# Patient Record
Sex: Female | Born: 1990 | Race: White | Hispanic: No | Marital: Single | Smoking: Never smoker
Health system: Southern US, Community
[De-identification: ages and names within clinical notes are randomized; demographics above are authoritative.]

## PROBLEM LIST (undated history)

## (undated) ENCOUNTER — Inpatient Hospital Stay (HOSPITAL_COMMUNITY): Payer: Self-pay

## (undated) DIAGNOSIS — R87629 Unspecified abnormal cytological findings in specimens from vagina: Secondary | ICD-10-CM

## (undated) DIAGNOSIS — R51 Headache: Secondary | ICD-10-CM

## (undated) DIAGNOSIS — F840 Autistic disorder: Secondary | ICD-10-CM

## (undated) DIAGNOSIS — F419 Anxiety disorder, unspecified: Secondary | ICD-10-CM

## (undated) DIAGNOSIS — G8929 Other chronic pain: Secondary | ICD-10-CM

## (undated) DIAGNOSIS — F329 Major depressive disorder, single episode, unspecified: Secondary | ICD-10-CM

## (undated) DIAGNOSIS — F32A Depression, unspecified: Secondary | ICD-10-CM

## (undated) DIAGNOSIS — F909 Attention-deficit hyperactivity disorder, unspecified type: Secondary | ICD-10-CM

## (undated) DIAGNOSIS — N83201 Unspecified ovarian cyst, right side: Secondary | ICD-10-CM

## (undated) DIAGNOSIS — R102 Pelvic and perineal pain: Secondary | ICD-10-CM

## (undated) DIAGNOSIS — R519 Headache, unspecified: Secondary | ICD-10-CM

## (undated) DIAGNOSIS — N83202 Unspecified ovarian cyst, left side: Secondary | ICD-10-CM

## (undated) DIAGNOSIS — M797 Fibromyalgia: Secondary | ICD-10-CM

## (undated) DIAGNOSIS — T883XXA Malignant hyperthermia due to anesthesia, initial encounter: Secondary | ICD-10-CM

## (undated) DIAGNOSIS — Q969 Turner's syndrome, unspecified: Secondary | ICD-10-CM

## (undated) HISTORY — DX: Pelvic and perineal pain: R10.2

## (undated) HISTORY — DX: Turner's syndrome, unspecified: Q96.9

## (undated) HISTORY — DX: Other chronic pain: G89.29

## (undated) HISTORY — DX: Fibromyalgia: M79.7

## (undated) HISTORY — PX: LAPAROTOMY: SHX154

## (undated) HISTORY — PX: SALPINGOOPHORECTOMY: SHX82

---
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
## 2010-03-07 ENCOUNTER — Encounter (INDEPENDENT_AMBULATORY_CARE_PROVIDER_SITE_OTHER): Payer: Self-pay | Admitting: Obstetrics and Gynecology

## 2010-03-07 ENCOUNTER — Ambulatory Visit (HOSPITAL_COMMUNITY): Admission: RE | Admit: 2010-03-07 | Discharge: 2010-03-08 | Payer: Self-pay | Admitting: Obstetrics and Gynecology

## 2010-07-24 LAB — CBC
HCT: 34.2 % — ABNORMAL LOW (ref 36.0–46.0)
HCT: 39.9 % (ref 36.0–46.0)
Hemoglobin: 11.4 g/dL — ABNORMAL LOW (ref 12.0–15.0)
Hemoglobin: 13.5 g/dL (ref 12.0–15.0)
MCH: 29.2 pg (ref 26.0–34.0)
MCH: 29.2 pg (ref 26.0–34.0)
MCHC: 33.2 g/dL (ref 30.0–36.0)
MCHC: 33.8 g/dL (ref 30.0–36.0)
MCV: 86.5 fL (ref 78.0–100.0)
MCV: 88.2 fL (ref 78.0–100.0)
Platelets: 182 10*3/uL (ref 150–400)
Platelets: 219 10*3/uL (ref 150–400)
RBC: 3.88 MIL/uL (ref 3.87–5.11)
RBC: 4.61 MIL/uL (ref 3.87–5.11)
RDW: 12.7 % (ref 11.5–15.5)
RDW: 13.1 % (ref 11.5–15.5)
WBC: 10.9 10*3/uL — ABNORMAL HIGH (ref 4.0–10.5)
WBC: 5.9 10*3/uL (ref 4.0–10.5)

## 2010-07-24 LAB — PREGNANCY, URINE: Preg Test, Ur: NEGATIVE

## 2010-07-24 LAB — SURGICAL PCR SCREEN
MRSA, PCR: NEGATIVE
Staphylococcus aureus: POSITIVE — AB

## 2010-08-05 LAB — CBC
HCT: 43 % (ref 36.0–46.0)
Hemoglobin: 14.1 g/dL (ref 12.0–15.0)
MCHC: 32.8 g/dL (ref 30.0–36.0)
MCV: 90.3 fL (ref 78.0–100.0)
Platelets: 298 10*3/uL (ref 150–400)
RBC: 4.76 MIL/uL (ref 3.87–5.11)
RDW: 14.6 % (ref 11.5–15.5)
WBC: 8.9 10*3/uL (ref 4.0–10.5)

## 2010-08-05 LAB — PREGNANCY, URINE: Preg Test, Ur: NEGATIVE

## 2010-09-27 NOTE — Consult Note (Signed)
NAMEKERIGAN, Amber Hancock NO.:  [id number]   MEDICAL RECORD NO.:  [id number]                   PATIENT TYPE:  EMS   LOCATION:  URG                                  FACILITY:  [facility]   PHYSICIAN:  Artist Pais. Mina Marble, M.D.           DATE OF BIRTH:  [birth year]   DATE OF CONSULTATION:  04/03/2003  DATE OF DISCHARGE:                                   CONSULTATION   REFERRING PHYSICIAN:  Elvina Sidle, M.D.   REASON FOR CONSULTATION:  Carrol is a 20 year old right-hand dominant female  who fell onto her left thumb and presents with what appears to be a  dislocated CMC joint.  She is an otherwise healthy 20 year old.   ALLERGIES:  No known drug allergies..   CURRENT MEDICATIONS:  None.   No recent hospitalizations or surgeries.   FAMILY HISTORY:  Noncontributory.   SOCIAL HISTORY:  Noncontributory.   PHYSICAL EXAMINATION:  She has what appears to be a dislocated CMC joint  on  x-ray.  On clinical examination, she is tender over the [facility] joint.  We  discussed infiltrative block.  She does not want this.  We gently applied  traction and manipulation to the base of the metacarpal and it seemed to  reduce.  Post reduction film showed some slight subluxation but equal to her  other side with comparison views of the right.  She was placed in a well-  padded thumb spica splint and will follow up in my office in 48 hours.                                               Artist Pais Mina Marble, M.D.    MAW/MEDQ  D:  04/03/2003  T:  04/03/2003  Job:  [id number]

## 2012-02-11 ENCOUNTER — Ambulatory Visit (INDEPENDENT_AMBULATORY_CARE_PROVIDER_SITE_OTHER): Payer: BC Managed Care – PPO | Admitting: Internal Medicine

## 2012-02-11 ENCOUNTER — Encounter: Payer: Self-pay | Admitting: Internal Medicine

## 2012-02-11 VITALS — BP 133/97 | HR 76 | Resp 18 | Ht 62.0 in | Wt 120.8 lb

## 2012-02-11 DIAGNOSIS — R55 Syncope and collapse: Secondary | ICD-10-CM

## 2012-02-11 MED ORDER — FLUDROCORTISONE ACETATE 0.1 MG PO TABS: 0.0500 mg | ORAL_TABLET | Freq: Two times a day (BID) | ORAL | Status: DC

## 2012-02-11 NOTE — Patient Instructions (Addendum)
Your physician recommends that you schedule a follow-up appointment in: 3 months with Dr Ladona Ridgel  Your physician has recommended you make the following change in your medication: START Florinef 0.1 mg 1/2 tab twice daily  INCREASE fluid and salt intake and DECREASE caffeine intake

## 2012-02-11 NOTE — Progress Notes (Signed)
HPI Amber Hancock is referred today by Dr. Vincente Poli for evaluation of recurrent syncope. The patient has a h/o recurrent syncope dating back several years. These episodes typically occur with and are associated with lower pelvic pain. She will feel clammy, dizzy and lightheaded and pass out. Her boyfriend who is with her today notes that she will be out only a minute or two. No tongue biting or loss of bowel or bladder continency. She notes associate ringing in her ears and reduced hearing prior to the episodes. No other complaints. She also carries a diagnosis of panic attacks and anxiety disorder. Not on File   Current Outpatient Prescriptions  Medication Sig Dispense Refill  . diazepam (VALIUM) 5 MG tablet Take 5 mg by mouth every 12 (twelve) hours as needed.       Marland Kitchen HYDROmorphone (DILAUDID) 4 MG tablet Take 4 mg by mouth every 4 (four) hours as needed.       Marland Kitchen MEPERITAB 50 MG tablet 50 mg every 4 (four) hours as needed.       . ondansetron (ZOFRAN-ODT) 8 MG disintegrating tablet Take 8 mg by mouth every 8 (eight) hours as needed.       . traMADol (ULTRAM) 50 MG tablet       . fludrocortisone (FLORINEF) 0.1 MG tablet Take 0.5 tablets (0.05 mg total) by mouth 2 (two) times daily.  30 tablet  6     No past medical history on file.  ROS:   All systems reviewed and negative except as noted in the HPI.   No past surgical history on file.   No family history on file.   History   Social History  . Marital Status: Single    Spouse Name: N/A    Number of Children: N/A  . Years of Education: N/A   Occupational History  . Not on file.   Social History Main Topics  . Smoking status: Never Smoker   . Smokeless tobacco: Not on file  . Alcohol Use: No  . Drug Use: Not on file  . Sexually Active: Not on file   Other Topics Concern  . Not on file   Social History Narrative  . No narrative on file     BP 133/97  Pulse 76  Resp 18  Ht 5\' 2"  (1.575 m)  Wt 120 lb 12.8 oz (54.795  kg)  BMI 22.09 kg/m2  SpO2 98%  Physical Exam:  Well appearing 21 year old woman, with multiple tattoo's but  NAD HEENT: Unremarkable Neck:  No JVD, no thyromegally Lungs:  Clear with no wheezes, rales, or rhonchi HEART:  Regular rate rhythm, no murmurs, no rubs, no clicks Abd:  soft, positive bowel sounds, no organomegally, no rebound, no guarding Ext:  2 plus pulses, no edema, no cyanosis, no clubbing Skin:  No rashes no nodules Neuro:  CN II through XII intact, motor grossly intact  EKG NSR with borderline LAA  Assess/Plan:

## 2012-02-11 NOTE — Assessment & Plan Note (Signed)
I have discussed the most likely etiology of her symptoms which would be neurally mediated syncope. We discussed the connection to her pelvic pain, and other factors which would make her symptoms worse. I have recommended that she increase her sodium intake as well as her fluid intake. And that she avoid caffiene, ETOH and illicit drug use. Also, I have asked her to start taking florinef 0.05 mg twice daily. Finally I have discussed the long term expectation for improvement and the role of stress and stress management.

## 2012-05-20 ENCOUNTER — Ambulatory Visit: Payer: BC Managed Care – PPO | Admitting: Internal Medicine

## 2012-05-20 ENCOUNTER — Encounter: Payer: Self-pay | Admitting: Cardiology

## 2012-05-25 ENCOUNTER — Encounter: Payer: Self-pay | Admitting: Internal Medicine

## 2012-08-14 DIAGNOSIS — G8929 Other chronic pain: Secondary | ICD-10-CM | POA: Insufficient documentation

## 2013-04-01 ENCOUNTER — Inpatient Hospital Stay (HOSPITAL_COMMUNITY)
Admission: AD | Admit: 2013-04-01 | Discharge: 2013-04-01 | Disposition: A | Discharge status: Home / Self Care | Payer: 59 | Source: Ambulatory Visit | Attending: Obstetrics & Gynecology | Admitting: Obstetrics & Gynecology

## 2013-04-01 ENCOUNTER — Encounter (HOSPITAL_COMMUNITY): Payer: Self-pay | Admitting: *Deleted

## 2013-04-01 ENCOUNTER — Inpatient Hospital Stay (HOSPITAL_COMMUNITY): Payer: 59

## 2013-04-01 DIAGNOSIS — N390 Urinary tract infection, site not specified: Secondary | ICD-10-CM | POA: Insufficient documentation

## 2013-04-01 DIAGNOSIS — R109 Unspecified abdominal pain: Secondary | ICD-10-CM | POA: Insufficient documentation

## 2013-04-01 DIAGNOSIS — R55 Syncope and collapse: Secondary | ICD-10-CM | POA: Insufficient documentation

## 2013-04-01 DIAGNOSIS — R42 Dizziness and giddiness: Secondary | ICD-10-CM | POA: Insufficient documentation

## 2013-04-01 DIAGNOSIS — N83209 Unspecified ovarian cyst, unspecified side: Secondary | ICD-10-CM | POA: Insufficient documentation

## 2013-04-01 HISTORY — DX: Anxiety disorder, unspecified: F41.9

## 2013-04-01 HISTORY — DX: Major depressive disorder, single episode, unspecified: F32.9

## 2013-04-01 HISTORY — DX: Depression, unspecified: F32.A

## 2013-04-01 LAB — URINALYSIS, ROUTINE W REFLEX MICROSCOPIC
Bilirubin Urine: NEGATIVE
Glucose, UA: NEGATIVE mg/dL
Hgb urine dipstick: NEGATIVE
Ketones, ur: NEGATIVE mg/dL
Leukocytes, UA: NEGATIVE
Nitrite: POSITIVE — AB
Protein, ur: NEGATIVE mg/dL
Specific Gravity, Urine: 1.015 (ref 1.005–1.030)
Urobilinogen, UA: 0.2 mg/dL (ref 0.0–1.0)
pH: 6 (ref 5.0–8.0)

## 2013-04-01 LAB — POCT PREGNANCY, URINE: Preg Test, Ur: NEGATIVE

## 2013-04-01 LAB — URINE MICROSCOPIC-ADD ON

## 2013-04-01 MED ORDER — IBUPROFEN 600 MG PO TABS: 600.0000 mg | ORAL_TABLET | Freq: Four times a day (QID) | ORAL | Status: DC | PRN

## 2013-04-01 MED ORDER — CIPROFLOXACIN HCL 500 MG PO TABS: 500.0000 mg | ORAL_TABLET | Freq: Two times a day (BID) | ORAL | Status: DC

## 2013-04-01 MED ORDER — ONDANSETRON HCL 4 MG PO TABS: 4.0000 mg | ORAL_TABLET | Freq: Three times a day (TID) | ORAL | Status: AC | PRN

## 2013-04-01 MED ORDER — PROMETHAZINE HCL 25 MG PO TABS: 12.5000 mg | ORAL_TABLET | Freq: Four times a day (QID) | ORAL | Status: DC | PRN

## 2013-04-01 MED ORDER — MEPERIDINE HCL 50 MG PO TABS: 25.0000 mg | ORAL_TABLET | ORAL | Status: DC | PRN

## 2013-04-01 NOTE — MAU Note (Signed)
Patient states she has had abdominal pain for about one week. States she has had nausea for 4-5 weeks with dry heaves. Denies bleeding or vaginal discharge. 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

## 2013-04-01 NOTE — MAU Provider Note (Signed)
Attestation of Attending Supervision of Advanced Practitioner (CNM/NP): Evaluation and management procedures were performed by the Advanced Practitioner under my supervision and collaboration.  I have reviewed the Advanced Practitioner's note and chart, and I agree with the management and plan.  HARRAWAY-SMITH, Camilia Caywood 5:59 PM

## 2013-04-01 NOTE — MAU Provider Note (Signed)
History     CSN: [id number]  Arrival date and time: 04/01/13 1031   First Provider Initiated Contact with Patient 04/01/13 1418      Chief Complaint  Patient presents with  . Abdominal Pain  . Nausea   HPI Comments: Amber Hancock 22 y.o. Presents with complaints of abdominal pain.  The patient has an extensive past GYN history with Dr. Vincente Poli. She was diagnosed with a left ovarian streak which was removed laproscopically.  She was also told that her uterus is "underdeveloped."  She has chronic abdominal pain, which has been managed outpatient.  4-5 days ago she noticed increased pain in her suprapubic region.  The pain is sudden and sharp, and is accompanied by dizziness and pre-syncope.  This morning she had one episode of nausea and vomiting.  Heat improves the pain.  She has tried midol without relief.  She denies vaginal discharge, pain with urination, fever, and chills.    Abdominal Pain Associated symptoms include nausea and vomiting. Pertinent negatives include no constipation or diarrhea.     Past Medical History  Diagnosis Date  . Chronic pelvic pain in female   . Streak gonad     Left  . Anxiety   . Depression     Past Surgical History  Procedure Laterality Date  . Salpingoophorectomy    . Laparotomy      Family History  Problem Relation Age of Onset  . Hypertension Other     Malignant    History  Substance Use Topics  . Smoking status: Never Smoker   . Smokeless tobacco: Not on file  . Alcohol Use: Yes     Comment: occasional    Allergies:  Allergies  Allergen Reactions  . Tylox [Oxycodone-Acetaminophen] Nausea And Vomiting  . Vicodin [Hydrocodone-Acetaminophen] Nausea And Vomiting  . Adhesive [Tape] Rash    Prescriptions prior to admission  Medication Sig Dispense Refill  . Acetaminophen-Caff-Pyrilamine (MIDOL COMPLETE PO) Take 2 tablets by mouth 2 (two) times daily as needed (for pms symptoms).      . ALPRAZolam (XANAX) 0.5 MG tablet Take  0.5 mg by mouth at bedtime as needed for anxiety or sleep.      Marland Kitchen ondansetron (ZOFRAN) 4 MG tablet Take 4 mg by mouth every 8 (eight) hours as needed for nausea or vomiting.        Review of Systems  Constitutional: Negative.   Gastrointestinal: Positive for nausea, vomiting and abdominal pain. Negative for heartburn, diarrhea and constipation.  Genitourinary: Negative.   Musculoskeletal: Positive for back pain (non-radiating).   Physical Exam   Blood pressure 121/86, pulse 81, temperature 98 F (36.7 C), temperature source Oral, resp. rate 20, height 5' 4.5" (1.638 m), weight 55.339 kg (122 lb), last menstrual period 03/01/2013, SpO2 99.00%.  Physical Exam  Constitutional: She is oriented to person, place, and time. She appears well-developed and well-nourished. No distress.  HENT:  Head: Normocephalic and atraumatic.  Eyes: Pupils are equal, round, and reactive to light.  Neck: Normal range of motion.  Cardiovascular: Normal rate and regular rhythm.   Respiratory: Effort normal and breath sounds normal. No respiratory distress.  GI: Soft. Bowel sounds are normal. She exhibits no distension and no mass. There is no tenderness. There is no rebound and no guarding.  Neurological: She is alert and oriented to person, place, and time.  Skin: Skin is warm and dry.  Psychiatric: She has a normal mood and affect.   Pt declines pelvic exam and labs.  US Transvaginal Non-ob  04/01/2013   CLINICAL DATA:  History of an ovarian streaks. Status post left oophorectomy. Pelvic pain.  EXAM: TRANSABDOMINAL AND TRANSVAGINAL ULTRASOUND OF PELVIS  TECHNIQUE: Both transabdominal and transvaginal ultrasound examinations of the pelvis were performed. Transabdominal technique was performed for global imaging of the pelvis including uterus, ovaries, adnexal regions, and pelvic cul-de-sac. It was necessary to proceed with endovaginal exam following the transabdominal exam to visualize the uterus and right  ovary to better advantage.  COMPARISON:  09/19/2008  FINDINGS: Uterus  Measurements: 6.2 cm x 3.9 cm x 5.3 cm. No fibroids or other mass visualized.  Endometrium  Thickness: 14 mm.  No focal abnormality visualized.  Right ovary  Measurements: 3.5 cm x 3.6 cm x 2.8 cm. There is a nearly isoechoic lesion with peripheral blood flow in the right ovary that is likely an involuting corpus luteum. Ovaries otherwise unremarkable. No adnexal mass.  Left ovary  Surgically absent  Other findings  Moderate free fluid. Consider a possible recent ovarian cyst rupture considering the quantity of fluid. The fluid appears simple.  IMPRESSION: 1. Normal uterus. 2. Right ovary with what appears to be a complex or involuting corpus luteum. The right ovary is otherwise unremarkable. 3. Status post left oophorectomy. 4. Moderate free fluid.  Query recent ruptured ovarian cyst.   Electronically Signed   By: Amie Portland M.D.   On: 04/01/2013 16:33   US Pelvis Complete  04/01/2013   CLINICAL DATA:  History of an ovarian streaks. Status post left oophorectomy. Pelvic pain.  EXAM: TRANSABDOMINAL AND TRANSVAGINAL ULTRASOUND OF PELVIS  TECHNIQUE: Both transabdominal and transvaginal ultrasound examinations of the pelvis were performed. Transabdominal technique was performed for global imaging of the pelvis including uterus, ovaries, adnexal regions, and pelvic cul-de-sac. It was necessary to proceed with endovaginal exam following the transabdominal exam to visualize the uterus and right ovary to better advantage.  COMPARISON:  09/19/2008  FINDINGS: Uterus  Measurements: 6.2 cm x 3.9 cm x 5.3 cm. No fibroids or other mass visualized.  Endometrium  Thickness: 14 mm.  No focal abnormality visualized.  Right ovary  Measurements: 3.5 cm x 3.6 cm x 2.8 cm. There is a nearly isoechoic lesion with peripheral blood flow in the right ovary that is likely an involuting corpus luteum. Ovaries otherwise unremarkable. No adnexal mass.  Left ovary   Surgically absent  Other findings  Moderate free fluid. Consider a possible recent ovarian cyst rupture considering the quantity of fluid. The fluid appears simple.  IMPRESSION: 1. Normal uterus. 2. Right ovary with what appears to be a complex or involuting corpus luteum. The right ovary is otherwise unremarkable. 3. Status post left oophorectomy. 4. Moderate free fluid.  Query recent ruptured ovarian cyst.   Electronically Signed   By: Amie Portland M.D.   On: 04/01/2013 16:33   MAU Course  Procedures  Urinalysis Transvaginal US- given pt's history of gynecological abnormalities and no baseline [place] in the system, [place] ordered   Assessment and Plan   A:   1. Ruptured cyst of ovary   2. UTI (lower urinary tract infection)     P: D/C home Teaching about ovarian cysts, recommend routine Gyn care, f/u with Gyn provider as needed Cipro 500 mg BID x7 days Phenergan 12.5-25 mg Q 6 hours Renewed Zofran 4 mg PO Q 8 hours Demerol 50 mg PO Q4 hours (Pt reports other narcotics have not worked) Ibuprofen 600 mg PO Q 6 hours Return to MAU as needed  LEFTWICH-KIRBY, Caeleb Batalla 04/01/2013, 3:16 PM

## 2013-04-01 NOTE — MAU Note (Signed)
Nausea for 4-5 days.

## 2013-06-07 ENCOUNTER — Other Ambulatory Visit (HOSPITAL_COMMUNITY): Payer: Self-pay | Admitting: Physician Assistant

## 2013-06-07 ENCOUNTER — Ambulatory Visit (HOSPITAL_COMMUNITY)
Admission: RE | Admit: 2013-06-07 | Discharge: 2013-06-07 | Disposition: A | Discharge status: Home / Self Care | Payer: BC Managed Care – PPO | Source: Ambulatory Visit | Attending: Physician Assistant | Admitting: Physician Assistant

## 2013-06-07 DIAGNOSIS — Z3200 Encounter for pregnancy test, result unknown: Secondary | ICD-10-CM

## 2013-06-07 DIAGNOSIS — N854 Malposition of uterus: Secondary | ICD-10-CM | POA: Insufficient documentation

## 2013-06-07 DIAGNOSIS — R109 Unspecified abdominal pain: Secondary | ICD-10-CM | POA: Insufficient documentation

## 2013-06-07 DIAGNOSIS — N838 Other noninflammatory disorders of ovary, fallopian tube and broad ligament: Secondary | ICD-10-CM | POA: Insufficient documentation

## 2013-06-08 ENCOUNTER — Encounter: Payer: Self-pay | Admitting: Obstetrics & Gynecology

## 2013-06-13 ENCOUNTER — Ambulatory Visit (INDEPENDENT_AMBULATORY_CARE_PROVIDER_SITE_OTHER): Payer: BC Managed Care – PPO | Admitting: Obstetrics & Gynecology

## 2013-06-13 ENCOUNTER — Other Ambulatory Visit: Payer: Self-pay | Admitting: *Deleted

## 2013-06-13 ENCOUNTER — Encounter: Payer: Self-pay | Admitting: Obstetrics & Gynecology

## 2013-06-13 VITALS — BP 110/80 | Temp 98.0°F | Ht 62.0 in | Wt 120.0 lb

## 2013-06-13 DIAGNOSIS — R109 Unspecified abdominal pain: Secondary | ICD-10-CM

## 2013-06-13 DIAGNOSIS — N926 Irregular menstruation, unspecified: Secondary | ICD-10-CM

## 2013-06-13 DIAGNOSIS — R102 Pelvic and perineal pain: Secondary | ICD-10-CM

## 2013-06-13 LAB — POCT URINE PREGNANCY: PREG TEST UR: NEGATIVE

## 2013-06-13 MED ORDER — HYDROMORPHONE HCL 2 MG PO TABS: 2.0000 mg | ORAL_TABLET | Freq: Four times a day (QID) | ORAL | Status: DC | PRN

## 2013-06-13 NOTE — Progress Notes (Signed)
Subjective:     Amber Hancock July is a 23 y.o. female here for a routine exam.  Current complaints: Patient is in office today for a problem visit. Patient has severe sharp abdominal pain. Patient states that it constantly feels like her uterus is tightening. Patient has not had a period since December. Patient states she has not even had any spotting. Patient states that previous doctor proved that there were no cysts and that she is not pregnant. Patient has a condition where her uterine lining thickens four times that of a normal woman and that it all sheds off at the same time. Patient states that she has contractions that a normal pregnant woman would have. Patient states that as far as her treatment she has only been on pain medication and a hormone blocker kind of like depo- provera which she has been off of for six months now.  Personal health questionnaire reviewed: yes.   Gynecologic History Patient's last menstrual period was 04/15/2013. Contraception: none Last Pap: 2014. Results were: normal  Obstetric History OB History  Gravida Para Term Preterm AB SAB TAB Ectopic Multiple Living  0                  The following portions of the patient's history were reviewed and updated as appropriate: allergies, current medications, past family history, past medical history, past social history, past surgical history and problem list.  Review of Systems Pertinent items are noted in HPI.    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 Mullerian dysgenesis Treatment options reviewed--antidepressant, neuromodulator, aromatase inhibitor or GnRH agonist, yoga, PT Offer referral-->[facility] Return after MRI At least 20 minutes face-to-face time with this patient

## 2013-06-14 LAB — TSH: TSH: 4.171 u[IU]/mL (ref 0.350–4.500)

## 2013-06-14 LAB — PROLACTIN: PROLACTIN: 15.5 ng/mL

## 2013-06-15 DIAGNOSIS — R102 Pelvic and perineal pain: Secondary | ICD-10-CM | POA: Insufficient documentation

## 2013-06-15 NOTE — Patient Instructions (Signed)
Pelvic Pain, Female °Female pelvic pain can be caused by many different things and start from a variety of places. Pelvic pain refers to pain that is located in the lower half of the abdomen and between your hips. The pain may occur over a short period of time (acute) or may be reoccurring (chronic). The cause of pelvic pain may be related to disorders affecting the female reproductive organs (gynecologic), but it may also be related to the bladder, kidney stones, an intestinal complication, or muscle or skeletal problems. Getting help right away for pelvic pain is important, especially if there has been severe, sharp, or a sudden onset of unusual pain. It is also important to get help right away because some types of pelvic pain can be life threatening.  °CAUSES  °Below are only some of the causes of pelvic pain. The causes of pelvic pain can be in one of several categories.  °· Gynecologic. °· Pelvic inflammatory disease. °· Sexually transmitted infection. °· Ovarian cyst or a twisted ovarian ligament (ovarian torsion). °· Uterine lining that grows outside the uterus (endometriosis). °· Fibroids, cysts, or tumors. °· Ovulation. °· Pregnancy. °· Pregnancy that occurs outside the uterus (ectopic pregnancy). °· Miscarriage. °· Labor. °· Abruption of the placenta or ruptured uterus. °· Infection. °· Uterine infection (endometritis). °· Bladder infection. °· Diverticulitis. °· Miscarriage related to a uterine infection (septic abortion). °· Bladder. °· Inflammation of the bladder (cystitis). °· Kidney stone(s). °· Gastrointenstinal. °· Constipation. °· Diverticulitis. °· Neurologic. °· Trauma. °· Feeling pelvic pain because of mental or emotional causes (psychosomatic). °· Cancers of the bowel or pelvis. °EVALUATION  °Your caregiver will want to take a careful history of your concerns. This includes recent changes in your health, a careful gynecologic history of your periods (menses), and a sexual history. Obtaining  your family history and medical history is also important. Your caregiver may suggest a pelvic exam. A pelvic exam will help identify the location and severity of the pain. It also helps in the evaluation of which organ system may be involved. In order to identify the cause of the pelvic pain and be properly treated, your caregiver may order tests. These tests may include:  °· A pregnancy test. °· Pelvic ultrasonography. °· An X-ray exam of the abdomen. °· A urinalysis or evaluation of vaginal discharge. °· Blood tests. °HOME CARE INSTRUCTIONS  °· Only take over-the-counter or prescription medicines for pain, discomfort, or fever as directed by your caregiver.   °· Rest as directed by your caregiver.   °· Eat a balanced diet.   °· Drink enough fluids to make your urine clear or pale yellow, or as directed.   °· Avoid sexual intercourse if it causes pain.   °· Apply warm or cold compresses to the lower abdomen depending on which one helps the pain.   °· Avoid stressful situations.   °· Keep a journal of your pelvic pain. Write down when it started, where the pain is located, and if there are things that seem to be associated with the pain, such as food or your menstrual cycle. °· Follow up with your caregiver as directed.   °SEEK MEDICAL CARE IF: °· Your medicine does not help your pain. °· You have abnormal vaginal discharge. °SEEK IMMEDIATE MEDICAL CARE IF:  °· You have heavy bleeding from the vagina.   °· Your pelvic pain increases.   °· You feel lightheaded or faint.   °· You have chills.   °· You have pain with urination or blood in your urine.   °· You have uncontrolled   diarrhea or vomiting.   °· You have a fever or persistent symptoms for more than 3 days. °· You have a fever and your symptoms suddenly get worse.   °· You are being physically or sexually abused.   °MAKE SURE YOU: °· Understand these instructions. °· Will watch your condition. °· Will get help if you are not doing well or get worse. °Document  Released: 03/25/2004 Document Revised: 10/28/2011 Document Reviewed: 08/18/2011 °[employer]® Patient Information ©2014 [employer]. ° °

## 2013-06-16 ENCOUNTER — Encounter: Payer: Self-pay | Admitting: Obstetrics & Gynecology

## 2013-06-16 LAB — ANTI MULLERIAN HORMONE: AMH AssessR: 5.17 ng/mL

## 2013-06-24 ENCOUNTER — Ambulatory Visit (HOSPITAL_COMMUNITY)
Admission: RE | Admit: 2013-06-24 | Discharge: 2013-06-24 | Disposition: A | Discharge status: Home / Self Care | Payer: BC Managed Care – PPO | Source: Ambulatory Visit | Attending: Obstetrics & Gynecology | Admitting: Obstetrics & Gynecology

## 2013-06-24 DIAGNOSIS — N926 Irregular menstruation, unspecified: Secondary | ICD-10-CM

## 2013-06-24 DIAGNOSIS — R109 Unspecified abdominal pain: Secondary | ICD-10-CM | POA: Insufficient documentation

## 2013-06-24 DIAGNOSIS — N854 Malposition of uterus: Secondary | ICD-10-CM | POA: Insufficient documentation

## 2013-06-24 MED ORDER — GADOBENATE DIMEGLUMINE 529 MG/ML IV SOLN
5.0000 mL | Freq: Once | INTRAVENOUS | Status: AC | PRN
  Administered 2013-06-24: 5 mL via INTRAVENOUS

## 2013-06-27 LAB — CHROMOSOME ANALYSIS, PERIPHERAL BLOOD

## 2013-07-04 ENCOUNTER — Ambulatory Visit (INDEPENDENT_AMBULATORY_CARE_PROVIDER_SITE_OTHER): Payer: BC Managed Care – PPO | Admitting: Obstetrics & Gynecology

## 2013-07-04 DIAGNOSIS — R102 Pelvic and perineal pain: Secondary | ICD-10-CM

## 2013-07-04 DIAGNOSIS — N949 Unspecified condition associated with female genital organs and menstrual cycle: Secondary | ICD-10-CM

## 2013-07-04 MED ORDER — GABAPENTIN 300 MG PO CAPS: ORAL_CAPSULE | ORAL | Status: DC

## 2013-07-04 NOTE — Patient Instructions (Addendum)
Gabapentin capsules or tablets What is this medicine? GABAPENTIN (GA ba pen tin) is used to control partial seizures in adults with epilepsy. It is also used to treat certain types of nerve pain. This medicine may be used for other purposes; ask your health care provider or pharmacist if you have questions. COMMON BRAND NAME(S): Gabarone , Neurontin What should I tell my health care provider before I take this medicine? They need to know if you have any of these conditions: -kidney disease -suicidal thoughts, plans, or attempt; a previous suicide attempt by you or a family member -an unusual or allergic reaction to gabapentin, other medicines, foods, dyes, or preservatives -pregnant or trying to get pregnant -breast-feeding How should I use this medicine? Take this medicine by mouth with a glass of water. Follow the directions on the prescription label. You can take it with or without food. If it upsets your stomach, take it with food.Take your medicine at regular intervals. Do not take it more often than directed. Do not stop taking except on your doctor's advice. If you are directed to break the 600 or 800 mg tablets in half as part of your dose, the extra half tablet should be used for the next dose. If you have not used the extra half tablet within 28 days, it should be thrown away. A special MedGuide will be given to you by the pharmacist with each prescription and refill. Be sure to read this information carefully each time. Talk to your pediatrician regarding the use of this medicine in children. Special care may be needed. Overdosage: If you think you have taken too much of this medicine contact a poison control center or emergency room at once. NOTE: This medicine is only for you. Do not share this medicine with others. What if I miss a dose? If you miss a dose, take it as soon as you can. If it is almost time for your next dose, take only that dose. Do not take double or extra  doses. What may interact with this medicine? Do not take this medicine with any of the following medications: -other gabapentin products This medicine may also interact with the following medications: -alcohol -antacids -antihistamines for allergy, cough and cold -certain medicines for anxiety or sleep -certain medicines for depression or psychotic disturbances -homatropine; hydrocodone -naproxen -narcotic medicines (opiates) for pain -phenothiazines like chlorpromazine, mesoridazine, prochlorperazine, thioridazine This list may not describe all possible interactions. Give your health care provider a list of all the medicines, herbs, non-prescription drugs, or dietary supplements you use. Also tell them if you smoke, drink alcohol, or use illegal drugs. Some items may interact with your medicine. What should I watch for while using this medicine? Visit your doctor or health care professional for regular checks on your progress. You may want to keep a record at home of how you feel your condition is responding to treatment. You may want to share this information with your doctor or health care professional at each visit. You should contact your doctor or health care professional if your seizures get worse or if you have any new types of seizures. Do not stop taking this medicine or any of your seizure medicines unless instructed by your doctor or health care professional. Stopping your medicine suddenly can increase your seizures or their severity. Wear a medical identification bracelet or chain if you are taking this medicine for seizures, and carry a card that lists all your medications. You may get drowsy, dizzy, or have   blurred vision. Do not drive, use machinery, or do anything that needs mental alertness until you know how this medicine affects you. To reduce dizzy or fainting spells, do not sit or stand up quickly, especially if you are an older patient. Alcohol can increase drowsiness and  dizziness. Avoid alcoholic drinks. Your mouth may get dry. Chewing sugarless gum or sucking hard candy, and drinking plenty of water will help. The use of this medicine may increase the chance of suicidal thoughts or actions. Pay special attention to how you are responding while on this medicine. Any worsening of mood, or thoughts of suicide or dying should be reported to your health care professional right away. Women who become pregnant while using this medicine may enroll in the [place] American Antiepileptic Drug Pregnancy Registry by calling 1-888-233-2334. This registry collects information about the safety of antiepileptic drug use during pregnancy. What side effects may I notice from receiving this medicine? Side effects that you should report to your doctor or health care professional as soon as possible: -allergic reactions like skin rash, itching or hives, swelling of the face, lips, or tongue -worsening of mood, thoughts or actions of suicide or dying Side effects that usually do not require medical attention (report to your doctor or health care professional if they continue or are bothersome): -constipation -difficulty walking or controlling muscle movements -dizziness -nausea -slurred speech -tiredness -tremors -weight gain This list may not describe all possible side effects. Call your doctor for medical advice about side effects. You may report side effects to FDA at 1-800-FDA-1088. Where should I keep my medicine? Keep out of reach of children. Store at room temperature between 15 and 30 degrees C (59 and 86 degrees F). Throw away any unused medicine after the expiration date. NOTE: This sheet is a summary. It may not cover all possible information. If you have questions about this medicine, talk to your doctor, pharmacist, or health care provider.  2014, Elsevier/Gold Standard. (2012-12-30 09:12:48)  

## 2013-07-05 ENCOUNTER — Encounter: Payer: Self-pay | Admitting: Obstetrics & Gynecology

## 2013-07-05 NOTE — Progress Notes (Signed)
The patient returns for follow-up.  The recent MRI/lab results were reviewed.  A/P Chronic pelvic pain--DDX--endometriosis, adhesions, chronic bladder pain syndrome, pelvic congestion syndrome, functional bowel d/o, psychogenic, myofascial pain -->considering trial of neurontin -->referral-->[facility] -->Return prn

## 2013-09-16 ENCOUNTER — Encounter: Payer: Self-pay | Admitting: Obstetrics & Gynecology

## 2013-09-21 DIAGNOSIS — R252 Cramp and spasm: Secondary | ICD-10-CM | POA: Insufficient documentation

## 2013-09-21 DIAGNOSIS — IMO0001 Reserved for inherently not codable concepts without codable children: Secondary | ICD-10-CM | POA: Insufficient documentation

## 2013-09-21 DIAGNOSIS — N915 Oligomenorrhea, unspecified: Secondary | ICD-10-CM | POA: Insufficient documentation

## 2013-11-09 ENCOUNTER — Ambulatory Visit: Payer: BC Managed Care – PPO | Attending: Gastroenterology | Admitting: Physical Therapy

## 2013-11-09 DIAGNOSIS — M2420 Disorder of ligament, unspecified site: Secondary | ICD-10-CM | POA: Insufficient documentation

## 2013-11-09 DIAGNOSIS — M629 Disorder of muscle, unspecified: Secondary | ICD-10-CM | POA: Insufficient documentation

## 2013-11-09 DIAGNOSIS — M62838 Other muscle spasm: Secondary | ICD-10-CM | POA: Insufficient documentation

## 2013-11-09 DIAGNOSIS — Z5331 Laparoscopic surgical procedure converted to open procedure: Secondary | ICD-10-CM | POA: Insufficient documentation

## 2013-11-09 DIAGNOSIS — IMO0001 Reserved for inherently not codable concepts without codable children: Secondary | ICD-10-CM | POA: Diagnosis present

## 2013-11-16 ENCOUNTER — Ambulatory Visit: Payer: BC Managed Care – PPO | Admitting: Physical Therapy

## 2013-11-16 DIAGNOSIS — IMO0001 Reserved for inherently not codable concepts without codable children: Secondary | ICD-10-CM | POA: Diagnosis not present

## 2013-11-23 ENCOUNTER — Ambulatory Visit: Payer: BC Managed Care – PPO | Admitting: Physical Therapy

## 2013-11-23 DIAGNOSIS — IMO0001 Reserved for inherently not codable concepts without codable children: Secondary | ICD-10-CM | POA: Diagnosis not present

## 2013-12-14 ENCOUNTER — Ambulatory Visit: Payer: BC Managed Care – PPO | Attending: Physician Assistant | Admitting: Physical Therapy

## 2013-12-14 DIAGNOSIS — M2420 Disorder of ligament, unspecified site: Secondary | ICD-10-CM | POA: Diagnosis not present

## 2013-12-14 DIAGNOSIS — M62838 Other muscle spasm: Secondary | ICD-10-CM | POA: Insufficient documentation

## 2013-12-14 DIAGNOSIS — IMO0001 Reserved for inherently not codable concepts without codable children: Secondary | ICD-10-CM | POA: Insufficient documentation

## 2013-12-14 DIAGNOSIS — Z5331 Laparoscopic surgical procedure converted to open procedure: Secondary | ICD-10-CM | POA: Insufficient documentation

## 2013-12-14 DIAGNOSIS — M629 Disorder of muscle, unspecified: Secondary | ICD-10-CM | POA: Diagnosis not present

## 2013-12-21 ENCOUNTER — Ambulatory Visit: Payer: BC Managed Care – PPO | Admitting: Physical Therapy

## 2013-12-21 DIAGNOSIS — IMO0001 Reserved for inherently not codable concepts without codable children: Secondary | ICD-10-CM | POA: Diagnosis not present

## 2013-12-30 ENCOUNTER — Ambulatory Visit: Payer: BC Managed Care – PPO | Admitting: Physical Therapy

## 2014-01-05 ENCOUNTER — Ambulatory Visit: Payer: BC Managed Care – PPO | Admitting: Physical Therapy

## 2014-01-05 DIAGNOSIS — IMO0001 Reserved for inherently not codable concepts without codable children: Secondary | ICD-10-CM | POA: Diagnosis not present

## 2014-01-17 ENCOUNTER — Ambulatory Visit: Payer: BC Managed Care – PPO | Admitting: Physical Therapy

## 2014-01-24 ENCOUNTER — Ambulatory Visit: Payer: BC Managed Care – PPO | Attending: Physician Assistant | Admitting: Physical Therapy

## 2014-01-24 DIAGNOSIS — Z5331 Laparoscopic surgical procedure converted to open procedure: Secondary | ICD-10-CM | POA: Insufficient documentation

## 2014-01-24 DIAGNOSIS — M62838 Other muscle spasm: Secondary | ICD-10-CM | POA: Insufficient documentation

## 2014-01-24 DIAGNOSIS — IMO0001 Reserved for inherently not codable concepts without codable children: Secondary | ICD-10-CM | POA: Insufficient documentation

## 2014-01-24 DIAGNOSIS — M2420 Disorder of ligament, unspecified site: Secondary | ICD-10-CM | POA: Insufficient documentation

## 2014-01-24 DIAGNOSIS — M629 Disorder of muscle, unspecified: Secondary | ICD-10-CM | POA: Diagnosis not present

## 2014-01-31 ENCOUNTER — Encounter: Payer: BC Managed Care – PPO | Admitting: Physical Therapy

## 2014-05-08 ENCOUNTER — Encounter: Payer: Self-pay | Admitting: *Deleted

## 2014-05-09 ENCOUNTER — Encounter: Payer: Self-pay | Admitting: Obstetrics & Gynecology

## 2015-01-11 ENCOUNTER — Telehealth (HOSPITAL_COMMUNITY): Payer: Self-pay | Admitting: Licensed Clinical Social Worker

## 2015-01-17 NOTE — Telephone Encounter (Signed)
hycgmj

## 2015-02-27 ENCOUNTER — Encounter: Payer: Self-pay | Admitting: Psychiatry

## 2015-02-27 ENCOUNTER — Ambulatory Visit (INDEPENDENT_AMBULATORY_CARE_PROVIDER_SITE_OTHER): Payer: BLUE CROSS/BLUE SHIELD | Admitting: Psychiatry

## 2015-02-27 VITALS — BP 122/78 | HR 99 | Temp 98.8°F | Ht 63.0 in | Wt 111.6 lb

## 2015-02-27 DIAGNOSIS — F419 Anxiety disorder, unspecified: Secondary | ICD-10-CM | POA: Insufficient documentation

## 2015-02-27 DIAGNOSIS — F4323 Adjustment disorder with mixed anxiety and depressed mood: Secondary | ICD-10-CM

## 2015-02-27 MED ORDER — FLUOXETINE HCL 10 MG PO CAPS: 10.0000 mg | ORAL_CAPSULE | Freq: Every day | ORAL | Status: DC

## 2015-02-27 MED ORDER — FLUOXETINE HCL 20 MG PO CAPS: 20.0000 mg | ORAL_CAPSULE | Freq: Every day | ORAL | Status: DC

## 2015-02-27 NOTE — Progress Notes (Signed)
Psychiatric Initial Adult Assessment   Patient Identification: Amber Hancock MRN:  [id number] Date of Evaluation:  02/27/2015 Referral Source: Referral from PCP- [facility]  Chief Complaint:   Chief Complaint    Establish Care; Anxiety; Panic Attack; Depression     Visit Diagnosis:    ICD-9-CM ICD-10-CM   1. Adjustment disorder with mixed anxiety and depressed mood 309.28 F43.23    Diagnosis:   Patient Active Problem List   Diagnosis Date Noted  . Anxiety [F41.9] 02/27/2015  . Blush [R23.2] 09/21/2013  . Spasm [R25.2] 09/21/2013  . Infrequent menses [N91.5] 09/21/2013  . Pelvic pain [R10.2] 06/15/2013  . Pelvic and perineal pain [R10.2] 06/15/2013  . Chronic female pelvic pain [N94.9, G89.29] 08/14/2012  . Syncope [R55] 02/11/2012   History of Present Illness:    She is a 24 year old female who presented for initial assessment accompanied by her boyfriend. She was referred from the mental health for initial psychiatric evaluation. Patient reported that she has a major medical diagnoses and she is very anxious about the same. She was noted to be shaking her leg initially during the interview as she reported that she is anxious about coming up for this appointment. She reported that her father contacted her mother on the Facebook approximately a year ago and told her that he has been diagnosed with [place] War syndrome. He was in [place] during that time period. 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 Patient reported that she has started seeing her PCP initially was unable to find out the main reason for her many medical issues. She has recently seen rheumatologist who has diagnosed her with fibromyalgia. Patient has been seen a neurologist who had is going to do a nerve conduction velocity on her due to a diagnosis of CMP-chronic myofascial pain. 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 She has also seen a gynecologist due to having groin pain on her sides as well as on the lower back and leading to general soreness and affecting her general activities of daily living. Patient reported that she is the only child in the 6 siblings who has been affected with her father's diagnosis of [place] War syndrome. Patient reported that everybody else is fine. Patient was noted to be fully involved in her interview and she stopped shaking her leg as well. She reported that she is currently responding well to the Prozac and it has helped with her anxiety and she does not take Xanax on a regular basis as it will cause constipation in addition to the Dilaudid and she is very much concerned about her constipation. She stated that she tries to eat healthy and to light activities and exercise. 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  She reported that she went to the pain management at [facility] in [place] but they did not prescribe her any medications at this time.  I also checked her [place] controlled-drug  history and  patient has recently filled Xanax prescribed by her primary care physician assistant at [facility].   Patient reported that she also gets upset easily when people will come to her and will  compliment her red here as she does not like the compliments and she does not want the people to be coming to her as she wants to stay in her own space. She wants to stay in her own home and wants to be in her own life.  She currently denied having any suicidal homicidal ideations or plans   Elements:  Location:  acute. Associated Signs/Symptoms: Anxiety depression nervousness and pain Depression Symptoms:  depressed  mood, fatigue, difficulty concentrating, hopelessness, (Hypo) Manic Symptoms:  denied Anxiety Symptoms:  Excessive Worry, Panic Symptoms, Psychotic Symptoms:  denied PTSD Symptoms: Negative NA  Past Medical History:  Past Medical History  Diagnosis Date  . Chronic pelvic pain in female   . Streak gonad     Left  . Anxiety   . Depression     Past Surgical History  Procedure Laterality Date  . Salpingoophorectomy    . Laparotomy    . Abdominal hysterectomy     Family History:  Family History  Problem Relation Age of Onset  . Hypertension Other     Malignant  . Diverticulitis Mother   . Hypertension Mother   . Cancer Paternal Grandmother   . Post-traumatic stress disorder Father   . Mental illness Brother   . Depression Sister    Social History:   Social History   Social History  . Marital Status: Single    Spouse Name: N/A  . Number of Children: N/A  . Years of Education: N/A   Social History Main Topics  . Smoking status: Never Smoker   . Smokeless tobacco: Never Used  . Alcohol Use: 0.0 - 1.2 oz/week    0-1 Glasses of wine, 0 Cans of beer, 0-1 Shots of liquor per week     Comment: occasional  . Drug Use: No  . Sexual Activity:    Partners: Male    Pharmacist, hospitalBirth Control/ Protection: None   Other Topics Concern  . None   Social History Narrative   Additional Social History:  Living with fiance for the past 4 years. 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  General Appearance: Fairly Groomed  Eye Contact:  Fair  Speech:  Clear and Coherent   Volume:  Normal  Mood:  Anxious and Depressed  Affect:  Congruent  Thought Process:  Coherent and Intact  Orientation:  Full (Time, Place, and Person)  Thought Content:  WDL  Suicidal Thoughts:  No  Homicidal Thoughts:  No  Memory:  Immediate;   Fair  Judgement:  Fair  Insight:  Lacking  Psychomotor Activity:  Normal  Concentration:  Fair  Recall:  FiservFair  Fund of Knowledge:Fair  Language: Fair  Akathisia:  No  Handed:  Right  AIMS (if indicated):    Assets:  Communication Skills Desire for Improvement Housing Social Support  ADL's:  Intact  Cognition: WNL  Sleep:  8   Is the patient at risk to self?  No. Has the patient been a risk to self in the past 6 months?  No. Has the patient been a risk to self within the distant past?  No. Is the patient a risk to others?  No. Has the patient been a risk to others in the past 6 months?  No. Has the patient been a risk to others within the distant past?  No.  Allergies:   Allergies  Allergen Reactions  . Adhesive [Tape] Rash and Hives    Reaction to adhesive bandage after surgery Reaction to adhesive bandage after surgery  . Gabapentin Other (See Comments)    Increased anxiety.  Did things she doesn't remember doing. Other reaction(s): Other Increased anxiety.Did things she doesn't remember doing.  Ofilia Neas [Oxycodone-Acetaminophen] Nausea And Vomiting  . Vicodin [Hydrocodone-Acetaminophen] Nausea And Vomiting   Current Medications: Current Outpatient Prescriptions  Medication Sig Dispense Refill  . ALPRAZolam (XANAX) 0.5 MG tablet Take 0.5 mg by mouth.    Marland Kitchen FLUoxetine (PROZAC) 20 MG capsule Take 20 mg by mouth.    Marland Kitchen HYDROmorphone (DILAUDID) 2 MG tablet Take 2 mg by mouth.    Marland Kitchen tiZANidine (ZANAFLEX) 2 MG tablet Take 2 mg by mouth.    . Acetaminophen-Caff-Pyrilamine (MIDOL COMPLETE PO) Take 2 tablets by mouth 2 (two) times daily as needed (for pms symptoms).    Marland Kitchen HYDROmorphone (DILAUDID) 2 MG tablet Take 1 tablet (2 mg  total) by mouth every 6 (six) hours as needed for severe pain. 90 tablet 0   No current facility-administered medications for this visit.    Previous Psychotropic Medications: Gabapentin, Zoloft. She is doing well on Prozac.   Substance Abuse History in the last 12 months:  No.  Consequences of Substance Abuse: Negative NA  Medical Decision Making:  Review of Psycho-Social Stressors (1), Review and summation of old records (2) and Established Problem, Worsening (2)  Treatment Plan Summary:  Diagnosis:  R/o Malingering R/o Somatization Disorder.  Medication management   Discussed with patient and her boyfriend at length about the medications treatment risks benefits and alternatives I will titrate the dose of Prozac to 30 mg by mouth daily She agreed with the plan. She does not want to take Xanax on a regular basis. I will also refer her to therapy and she demonstrated understanding Follow-up in one month or earlier     Brandy Hale 10/18/20169:27 AM

## 2015-03-05 ENCOUNTER — Ambulatory Visit (INDEPENDENT_AMBULATORY_CARE_PROVIDER_SITE_OTHER): Payer: BLUE CROSS/BLUE SHIELD | Admitting: Licensed Clinical Social Worker

## 2015-03-05 DIAGNOSIS — F4323 Adjustment disorder with mixed anxiety and depressed mood: Secondary | ICD-10-CM
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
## 2015-03-20 ENCOUNTER — Ambulatory Visit (INDEPENDENT_AMBULATORY_CARE_PROVIDER_SITE_OTHER): Payer: BLUE CROSS/BLUE SHIELD | Admitting: Psychiatry

## 2015-03-20 ENCOUNTER — Encounter: Payer: Self-pay | Admitting: Psychiatry

## 2015-03-20 VITALS — BP 122/84 | HR 93 | Temp 97.2°F | Ht 63.0 in | Wt 113.4 lb

## 2015-03-20 DIAGNOSIS — F329 Major depressive disorder, single episode, unspecified: Secondary | ICD-10-CM

## 2015-03-20 DIAGNOSIS — F450 Somatization disorder: Secondary | ICD-10-CM

## 2015-03-20 DIAGNOSIS — F4323 Adjustment disorder with mixed anxiety and depressed mood: Secondary | ICD-10-CM | POA: Diagnosis not present

## 2015-03-20 DIAGNOSIS — F32A Depression, unspecified: Secondary | ICD-10-CM

## 2015-03-20 MED ORDER — FLUOXETINE HCL 20 MG PO CAPS: 20.0000 mg | ORAL_CAPSULE | Freq: Every day | ORAL | Status: DC

## 2015-03-20 MED ORDER — FLUOXETINE HCL 10 MG PO CAPS: 10.0000 mg | ORAL_CAPSULE | Freq: Every day | ORAL | Status: DC

## 2015-03-20 NOTE — Progress Notes (Signed)
Psychiatric Follow up MD/NP Note   Patient Identification: Amber Hancock MRN:  [id number] Date of Evaluation:  03/20/2015 Referral Source: Referral from PCP- [facility]  Chief Complaint:   Chief Complaint    Follow-up; Medication Refill     Visit Diagnosis:    ICD-9-CM ICD-10-CM   1. Adjustment disorder with mixed anxiety and depressed mood 309.28 F43.23   2. 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 Anxiety [F41.9] 02/27/2015  . Blush [R23.2] 09/21/2013  . Spasm [R25.2] 09/21/2013  . Infrequent menses [N91.5] 09/21/2013  . Pelvic pain [R10.2] 06/15/2013  . Pelvic and perineal pain [R10.2] 06/15/2013  . Chronic female pelvic pain [N94.9, G89.29] 08/14/2012  . Syncope [R55] 02/11/2012   History of Present Illness:    She is a 24 year old female who presented for up accompanied by her boyfriend. She reported that she has noticed improvement in her symptoms since her Prozac dose was changed to 30 mg. She reported that she is also doing better with her therapy sessions with Joni Reining. She reported that she feels that her anxiety is under control and she can relate well to her symptoms. She reported that she has also be started on Lyrica by her neurologist for fibromyalgia. She sleeps better with the samples. She is applying for the prior authorization for the same. Patient reported that she is planning to move pain management in [place] as she did not get along well with her previous pain management. Patient continues to focus on her somatic symptoms and has been complaining of pelvic pain as well as other bodily pain symptoms  She appeared calm and alert during the interview. She reported that she has been following with Joni Reining on a monthly basis. She is planning to relocate to [place] after she graduates in December.   She currently denied having any suicidal homicidal ideations or plans.  Patient currently denied using any drugs or alcohol. She denied having any perceptual disturbances.   Elements:  Location:  acute. Associated Signs/Symptoms: Anxiety depression nervousness and pain Depression Symptoms:  depressed mood, fatigue, difficulty concentrating, hopelessness, (Hypo) Manic Symptoms:  denied Anxiety Symptoms:  Excessive Worry, Panic Symptoms, Psychotic Symptoms:  denied PTSD Symptoms: Negative NA  Past Medical History:  Past Medical History  Diagnosis Date  . Chronic pelvic pain in female   . Streak gonad     Left  . Anxiety   . Depression     Past Surgical History  Procedure Laterality Date  . Salpingoophorectomy    . Laparotomy    . Abdominal hysterectomy     Family History:  Family History  Problem Relation Age of Onset  . Hypertension Other     Malignant  . Diverticulitis Mother   . Hypertension Mother   . Cancer Paternal Grandmother   . Post-traumatic stress disorder Father   . Mental illness Brother   . Depression Sister    Social History:   Social History   Social History  . Marital Status: Single    Spouse Name: N/A  . Number of Children: N/A  . Years of Education: N/A   Social History Main Topics  . Smoking status: Never Smoker   . Smokeless tobacco: Never Used  . Alcohol Use: 0.0 - 1.2 oz/week    0-1 Glasses of wine, 0 Cans of beer, 0-1 Shots of liquor per week     Comment: occasional  .  Drug Use: No  . Sexual Activity:    Partners: Male    Birth Control/ Protection: None   Other Topics Concern  . None   Social History Narrative   Additional Social History:  Living with fiance for the past 4 years. 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  General Appearance: Fairly Groomed  Eye Contact:  Fair  Speech:  Clear and Coherent  Volume:  Normal  Mood:  Anxious and Depressed  Affect:  Congruent  Thought Process:  Coherent and Intact  Orientation:  Full (Time, Place, and Person)  Thought Content:  WDL  Suicidal Thoughts:  No  Homicidal Thoughts:  No  Memory:  Immediate;   Fair  Judgement:  Fair  Insight:  Lacking  Psychomotor Activity:  Normal  Concentration:  Fair  Recall:  [employer]Fair  Fund of Knowledge:Fair  Language: Fair  Akathisia:  No  Handed:  Right  AIMS (if indicated):    Assets:  Communication Skills Desire for Improvement Housing Social Support  ADL's:  Intact  Cognition: WNL  Sleep:  8   Is the patient at risk to self?  No. Has the patient been a risk to self in the past 6 months?  No. Has the patient been a risk to self within the distant past?  No. Is the patient a risk to others?  No. Has the patient been a risk to others in the past 6 months?  No. Has the patient been a risk to others within the distant past?  No.  Allergies:   Allergies  Allergen Reactions  . Adhesive [Tape] Rash and Hives    Reaction to adhesive bandage after surgery Reaction to adhesive bandage after surgery  . Gabapentin Other (See Comments)    Increased anxiety.  Did things she doesn't remember doing. Other reaction(s): Other Increased anxiety.Did things she doesn't remember doing.  Ofilia Neas. Tylox [Oxycodone-Acetaminophen] Nausea And Vomiting  . Vicodin [Hydrocodone-Acetaminophen] Nausea And Vomiting   Current Medications: Current Outpatient Prescriptions  Medication Sig Dispense Refill  . Acetaminophen-Caff-Pyrilamine (MIDOL COMPLETE PO) Take 2 tablets by mouth 2 (two) times daily as needed (for pms symptoms).    . ALPRAZolam (XANAX) 0.5 MG tablet Take 0.5 mg by mouth.    Marland Kitchen.  FLUoxetine (PROZAC) 10 MG capsule Take 1 capsule (10 mg total) by mouth daily. 30 capsule 3  . FLUoxetine (PROZAC) 20 MG capsule Take 1 capsule (20 mg total) by mouth daily. 30 capsule 1  . HYDROmorphone (DILAUDID) 2 MG tablet Take 1 tablet (2 mg total) by mouth every 6 (six) hours as needed for severe pain. 90 tablet 0  . ondansetron (ZOFRAN) 8 MG tablet   0  . pregabalin (LYRICA) 75 MG capsule Take 75 mg by mouth.    Marland Kitchen. tiZANidine (ZANAFLEX) 2 MG tablet Take 2 mg by mouth.    Marland Kitchen. HYDROmorphone (DILAUDID) 2 MG tablet Take 2 mg by mouth.     No current facility-administered medications for this visit.    Previous Psychotropic Medications: Gabapentin, Zoloft. She is doing well on Prozac.   Substance Abuse History  in the last 12 months:  No.  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  Medication management   Discussed with patient and her boyfriend at length about the medications treatment risks benefits and alternatives  Depression Continue Prozac to 30 mg by mouth daily  Anxiety She does not want to take Xanax on a regular basis.  Therapy Continue therapy and she demonstrated understanding   Follow-up in one month or earlier   More than 50% of the time spent in psychoeducation, counseling and coordination of care.    This note was generated in part or whole with voice recognition software. Voice regonition is usually quite accurate but there are transcription errors that can and very often do occur. I apologize for any typographical errors that were not detected and corrected.    Brandy Hale 11/8/20169:02 AM

## 2015-03-30 ENCOUNTER — Ambulatory Visit (INDEPENDENT_AMBULATORY_CARE_PROVIDER_SITE_OTHER): Payer: BLUE CROSS/BLUE SHIELD | Admitting: Licensed Clinical Social Worker

## 2015-03-30 ENCOUNTER — Ambulatory Visit: Payer: BLUE CROSS/BLUE SHIELD | Admitting: Licensed Clinical Social Worker

## 2015-03-30 DIAGNOSIS — F4323 Adjustment disorder with mixed anxiety and depressed mood: Secondary | ICD-10-CM | POA: Diagnosis not present

## 2015-03-30 NOTE — Progress Notes (Signed)
   THERAPIST PROGRESS NOTE  Session Time: 60min  Participation Level: Active  Behavioral Response: AlertAnxious  Type of Therapy: Individual Therapy  Treatment Goals addressed: Coping  Interventions: CBT, Motivational Interviewing, Solution Focused, Strength-based, Family Systems and Reframing  Summary: Amber Hancock is a 24 y.o. female who presents with continued symptoms of her diagnosis.  She contineus to have pink hair and express herself through clothing.She is having difficulty with her relationship with her mother and step dad.  She feels like an outcast but refuses to address thoughts and emotions with her mother.  She will Graduated from [employer] on Dec 15.  She has plans on moving to [place] to assist with her medical concerns.  She feels that the cold weather makes her feel worse.  She is using skills taught and is becoming self aware of her symptoms..   Suicidal/Homicidal: Nowithout intent/plan  Therapist Response:LCSW provided Patient with ongoing emotional support and encouragement.  Normalized her feelings.  Commended Patient on her progress and reinforced the importance of client staying focused on her own strengths and resources and resiliency. Processed various strategies for dealing with stressors.    Plan: Return again in 2 weeks.  Diagnosis: Axis I: Adjustment Disorder with Mixed Emotional Features    Axis II: No diagnosis    Amber Hancock 03/30/2015

## 2015-04-25 ENCOUNTER — Ambulatory Visit (INDEPENDENT_AMBULATORY_CARE_PROVIDER_SITE_OTHER): Payer: BLUE CROSS/BLUE SHIELD | Admitting: Licensed Clinical Social Worker

## 2015-04-25 DIAGNOSIS — F329 Major depressive disorder, single episode, unspecified: Secondary | ICD-10-CM

## 2015-04-25 DIAGNOSIS — F32A Depression, unspecified: Secondary | ICD-10-CM

## 2015-04-25 DIAGNOSIS — F450 Somatization disorder: Secondary | ICD-10-CM | POA: Diagnosis not present

## 2015-04-27 NOTE — Progress Notes (Signed)
   THERAPIST PROGRESS NOTE  Session Time: 60min  Participation Level: Active  Behavioral Response: CasualAlertAnxious  Type of Therapy: Individual Therapy  Treatment Goals addressed: Coping  Interventions: CBT, Motivational Interviewing, Solution Focused, Strength-based, Supportive, Family Systems and Reframing  Summary: Amber Hancock is a 24 y.o. female who presents with continued symptoms of her diagnosis.  She was tearful and states "I am scared" but unable to list why she is afraid.  Patient states that she is Graduating from [employer] tomorrow and she will be surrounded by a lot of people and she is unsure how to act or present herself to others.  Role played deep breathing techniques and how to socially interact with others for a brief moment. Patient states that she does not want a 8-5 job due to her pain. 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  Patient states that she had to give her cat to the vet due to her inability to care for his medical needs (kidney stones).  She states that she does not have comfort and her boyfriend has been argumentative towards her due to his limited finances.  Suicidal/Homicidal: Nowithout intent/plan  Therapist Response: LCSW provided Patient with ongoing emotional support and encouragement.  Normalized her feelings.  Commended Patient on her progress and reinforced the importance of client staying focused on her own strengths and resources and resiliency. Processed various strategies for dealing with stressors.    Plan: Return again in 2 weeks.  Diagnosis: Axis I: Depression    Axis II: No diagnosis    Marinda Elkicole M Davetta Olliff 04/27/2015

## 2015-05-01 ENCOUNTER — Ambulatory Visit: Payer: BLUE CROSS/BLUE SHIELD | Admitting: Licensed Clinical Social Worker

## 2015-05-02 DIAGNOSIS — G894 Chronic pain syndrome: Secondary | ICD-10-CM | POA: Insufficient documentation

## 2015-05-03 DIAGNOSIS — M797 Fibromyalgia: Secondary | ICD-10-CM | POA: Insufficient documentation

## 2015-05-17 ENCOUNTER — Ambulatory Visit (INDEPENDENT_AMBULATORY_CARE_PROVIDER_SITE_OTHER): Payer: BLUE CROSS/BLUE SHIELD | Admitting: Licensed Clinical Social Worker

## 2015-05-17 ENCOUNTER — Ambulatory Visit (INDEPENDENT_AMBULATORY_CARE_PROVIDER_SITE_OTHER): Payer: BLUE CROSS/BLUE SHIELD | Admitting: Psychiatry

## 2015-05-17 ENCOUNTER — Encounter: Payer: Self-pay | Admitting: Psychiatry

## 2015-05-17 VITALS — BP 122/78 | HR 84 | Temp 97.1°F | Ht 63.0 in | Wt 113.4 lb

## 2015-05-17 DIAGNOSIS — F4323 Adjustment disorder with mixed anxiety and depressed mood: Secondary | ICD-10-CM | POA: Diagnosis not present

## 2015-05-17 DIAGNOSIS — F32A Depression, unspecified: Secondary | ICD-10-CM

## 2015-05-17 DIAGNOSIS — F329 Major depressive disorder, single episode, unspecified: Secondary | ICD-10-CM | POA: Diagnosis not present

## 2015-05-17 DIAGNOSIS — F450 Somatization disorder: Secondary | ICD-10-CM

## 2015-05-17 MED ORDER — FLUOXETINE HCL 10 MG PO CAPS: 10.0000 mg | ORAL_CAPSULE | Freq: Every day | ORAL | Status: DC

## 2015-05-17 MED ORDER — ALPRAZOLAM 0.5 MG PO TABS: 0.5000 mg | ORAL_TABLET | Freq: Every evening | ORAL | Status: DC | PRN

## 2015-05-17 MED ORDER — FLUOXETINE HCL 20 MG PO CAPS: 20.0000 mg | ORAL_CAPSULE | Freq: Every day | ORAL | Status: DC

## 2015-05-17 NOTE — Progress Notes (Signed)
Psychiatric Follow up MD/NP Note   Patient Identification: Amber Hancock MRN:  [id number] Date of Evaluation:  05/17/2015 Referral Source: Referral from PCP- [facility]  Chief Complaint:   Chief Complaint    Follow-up; Medication Refill     Visit Diagnosis:    ICD-9-CM ICD-10-CM   1. Adjustment disorder with mixed anxiety and depressed mood 309.28 F43.23   2. 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 Fibromyalgia [M79.7] 05/03/2015  . Chronic pain associated with significant psychosocial dysfunction [G89.4] 05/02/2015  . Anxiety [F41.9] 02/27/2015  . Blush [R23.2] 09/21/2013  . Spasm [R25.2] 09/21/2013  . Infrequent menses [N91.5] 09/21/2013  . Pelvic pain [R10.2] 06/15/2013  . Pelvic and perineal pain [R10.2] 06/15/2013  . Chronic female pelvic pain [N94.9, G89.29] 08/14/2012  . Syncope [R55] 02/11/2012   History of Present Illness:    She is a 25 year old female who presented for up accompanied by her boyfriend. She reported that her cat became sick before the Thanksgiving and she was taken to the emergency room. Patient reported that she was concerned about her and did fairly well on her final exams. She finally graduated in December. Her boyfriend lost his job in the early part of January. She reported that now she is applying for the jobs and is looking for any position. Patient reported that she is also taking the medications she and is following with [place] pain [place] in [place]. She is doing well on Lyrica. She reported that she ran out of her Prozac 20 mg prescription although she was given 3 refills. Her primary care was able to fill the refill of the medication. She appeared calm and happy during the interview. She responded well to all the questions. Her boyfriend remains supportive.    She currently denied having any suicidal homicidal ideations or plans. Patient currently  denied using any drugs or alcohol. She denied having any perceptual disturbances.    Past Medical History:  Past Medical History  Diagnosis Date  . Chronic pelvic pain in female   . Streak gonad     Left  . Anxiety   . Depression   . Fibromyalgia     Past Surgical History  Procedure Laterality Date  . Salpingoophorectomy    . Laparotomy    . Abdominal hysterectomy     Family History:  Family History  Problem Relation Age of Onset  . Hypertension Other     Malignant  . Diverticulitis Mother   . Hypertension Mother   . Cancer Paternal Grandmother   . Post-traumatic stress disorder Father   . Mental illness Brother   . Depression Sister    Social History:   Social History   Social History  . Marital Status: Single    Spouse Name: N/A  . Number of Children: N/A  . Years of Education: N/A   Social History Main Topics  . Smoking status: Never Smoker   . Smokeless tobacco: Never Used  . Alcohol Use: 0.0 - 1.2 oz/week    0-1 Glasses of wine, 0 Cans of beer, 0-1 Shots of liquor per week     Comment: occasional  . Drug Use: No  . Sexual Activity:    Partners: Male    Pharmacist, hospitalBirth Control/ Protection: None   Other Topics Concern  . None   Social History Narrative   Additional Social History:  Living with fiance for  the past 4 years. 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  General Appearance: Fairly Groomed  Eye Contact:  Fair  Speech:  Clear and Coherent  Volume:  Normal  Mood:  Anxious and Depressed  Affect:  Congruent  Thought Process:  Coherent  and Intact  Orientation:  Full (Time, Place, and Person)  Thought Content:  WDL  Suicidal Thoughts:  No  Homicidal Thoughts:  No  Memory:  Immediate;   Fair  Judgement:  Fair  Insight:  Lacking  Psychomotor Activity:  Normal  Concentration:  Fair  Recall:  [employer] of Knowledge:Fair  Language: Fair  Akathisia:  No  Handed:  Right  AIMS (if indicated):    Assets:  Communication Skills Desire for Improvement Housing Social Support  ADL's:  Intact  Cognition: WNL  Sleep:  8   Is the patient at risk to self?  No. Has the patient been a risk to self in the past 6 months?  No. Has the patient been a risk to self within the distant past?  No. Is the patient a risk to others?  No. Has the patient been a risk to others in the past 6 months?  No. Has the patient been a risk to others within the distant past?  No.  Allergies:   Allergies  Allergen Reactions  . Adhesive [Tape] Rash and Hives    Reaction to adhesive bandage after surgery Reaction to adhesive bandage after surgery  . Gabapentin Other (See Comments)    Increased anxiety.  Did things she doesn't remember doing. Other reaction(s): Other Increased anxiety.Did things she doesn't remember doing.  Ofilia Neas [Oxycodone-Acetaminophen] Nausea And Vomiting  . Vicodin [Hydrocodone-Acetaminophen] Nausea And Vomiting  . Other Hives    Reaction to adhesive bandage after surgery   Current Medications: Current Outpatient Prescriptions  Medication Sig Dispense Refill  . ALPRAZolam (XANAX) 0.5 MG tablet Take 0.5 mg by mouth.    Marland Kitchen FLUoxetine (PROZAC) 10 MG capsule Take 1 capsule (10 mg total) by mouth daily. 30 capsule 3  . FLUoxetine (PROZAC) 20 MG capsule Take 1 capsule (20 mg total) by mouth daily. 30 capsule 3  . LYRICA 50 MG capsule   0  . ondansetron (ZOFRAN) 8 MG tablet   0  . pregabalin (LYRICA) 75 MG capsule Take 75 mg by mouth.    . traMADol (ULTRAM) 50 MG tablet Take 50 mg by mouth.     No current  facility-administered medications for this visit.    Previous Psychotropic Medications: Gabapentin, Zoloft. She is doing well on Prozac.   Substance Abuse History in the last 12 months:  No.  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  Medication management   Discussed with patient and her boyfriend at length about the medications treatment risks benefits and alternatives  Depression Continue Prozac to 30 mg by mouth daily-  she was given 90 day supply of both Prozac 20 mg and 10 mg daily.  Anxiety She does not take Xanax on a regular basis. She was also given 2 month supply of the Xanax  Therapy Continue therapy and she demonstrated understanding   Follow-up in one month or earlier   More than 50% of the time spent in psychoeducation, counseling and coordination of care.    This note was generated in part or whole with voice recognition software. Voice regonition is usually quite accurate but there are transcription errors that can and very often do occur. I apologize for any typographical errors that were not detected and corrected.   Brandy Hale, MD    1/5/20178:55 AM

## 2015-05-30 NOTE — Progress Notes (Signed)
   THERAPIST PROGRESS NOTE  Session Time:  Participation Level: Active  Behavioral Response: CasualAlertDepressed  Type of Therapy: Individual Therapy  Treatment Goals addressed: Anxiety  Interventions: CBT, Motivational Interviewing, Solution Focused, Supportive, Family Systems and Reframing  Summary: Amber Hancock is a 25 y.o. female who presents with continued symptoms of her diagnosis. She continues to have pains throughout her body and she continues to have difficulty with family relationships.  Today she discussed current stressors such as: her boyfriend being terminated from his job, her recent Graduation from college and her change of hair color.  She reports not liking her hair due to her having to find a job and assimilating with society.  She reports that she feels like an individual when her hair is pink.  She reports that she has to change her outlook but the rest of the hair does not.  She reports continued shaking, loss of breathe while thinking about it.  She was able to use deep breathing techniques to calm down and reports that she is learning how to cope.   Suicidal/Homicidal: Nowithout intent/plan  Therapist Response: LCSW provided Patient with ongoing emotional support and encouragement.  Normalized her feelings.  Commended Patient on her progress and reinforced the importance of client staying focused on her own strengths and resources and resiliency. Processed various strategies for dealing with stressors.    Plan: Return again in4 weeks.  Diagnosis: Axis I: Adjustment Disorder with Mixed Emotional Features    Axis II: No diagnosis    Marinda Elk 05/17/15

## 2015-06-18 ENCOUNTER — Ambulatory Visit (INDEPENDENT_AMBULATORY_CARE_PROVIDER_SITE_OTHER): Payer: BLUE CROSS/BLUE SHIELD | Admitting: Licensed Clinical Social Worker

## 2015-06-18 DIAGNOSIS — F450 Somatization disorder: Secondary | ICD-10-CM | POA: Diagnosis not present

## 2015-06-18 DIAGNOSIS — F329 Major depressive disorder, single episode, unspecified: Secondary | ICD-10-CM

## 2015-06-18 DIAGNOSIS — F32A Depression, unspecified: Secondary | ICD-10-CM

## 2015-06-27 NOTE — Progress Notes (Signed)
   THERAPIST PROGRESS NOTE  Session Time:  Participation Level: Active  Behavioral Response: CasualAlertDepressed  Type of Therapy: Individual Therapy  Treatment Goals addressed: Coping and Diagnosis: Depression  Interventions: CBT, Motivational Interviewing, Solution Focused, Supportive, Family Systems and Reframing  Summary: Amber Hancock is a 25 y.o. female who presents with continued symptoms of her diagnosis. Discussion of her current MH diagnosis.  Patient listed her current stressors (working, dyeing her hair, her boyfriend, relationship with siblings & her ill cat).  Patient was encouraged her express herself and list coping skills that do not work.  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  Patient appeared to be happy aeb smiling, change in mood.  Discussion of photography as a coping mechanism for her.  Provided Patient with information on how to use photography to cope with stressors.    Suicidal/Homicidal: Nowithout intent/plan  Therapist Response: LCSW provided Patient with ongoing emotional support and encouragement.  Normalized her feelings.  Commended Patient on her progress and reinforced the importance of client staying focused on her own strengths and resources and resiliency. Processed various strategies for dealing with stressors.    Plan: Return again in 2 weeks.  Diagnosis: Axis I: Depression    Axis II: No diagnosis    Marinda Elk 06/27/2015

## 2015-07-12 ENCOUNTER — Encounter: Payer: Self-pay | Admitting: Psychiatry

## 2015-07-12 ENCOUNTER — Other Ambulatory Visit: Payer: Self-pay | Admitting: Psychiatry

## 2015-07-12 ENCOUNTER — Ambulatory Visit (INDEPENDENT_AMBULATORY_CARE_PROVIDER_SITE_OTHER): Payer: BLUE CROSS/BLUE SHIELD | Admitting: Psychiatry

## 2015-07-12 VITALS — BP 120/80 | HR 108 | Temp 98.9°F | Ht 63.0 in | Wt 113.6 lb

## 2015-07-12 DIAGNOSIS — F4323 Adjustment disorder with mixed anxiety and depressed mood: Secondary | ICD-10-CM | POA: Diagnosis not present

## 2015-07-12 MED ORDER — TRAZODONE HCL 50 MG PO TABS: 50.0000 mg | ORAL_TABLET | Freq: Every day | ORAL | Status: DC

## 2015-07-12 MED ORDER — FLUOXETINE HCL 10 MG PO CAPS: 10.0000 mg | ORAL_CAPSULE | Freq: Every day | ORAL | Status: DC

## 2015-07-12 MED ORDER — FLUOXETINE HCL 20 MG PO CAPS: 20.0000 mg | ORAL_CAPSULE | Freq: Every day | ORAL | Status: DC

## 2015-07-12 NOTE — Progress Notes (Signed)
Psychiatric Follow up MD/NP Note   Patient Identification: Amber Hancock MRN:  [id number] Date of Evaluation:  07/12/2015 Referral Source: Referral from PCP- [facility]  Chief Complaint:   Chief Complaint    Follow-up; Medication Refill; Anxiety; Other     Visit Diagnosis:    ICD-9-CM ICD-10-CM   1. Adjustment disorder with mixed anxiety and depressed mood 309.28 F43.23    Diagnosis:   Patient Active Problem List   Diagnosis Date Noted  . Fibromyalgia [M79.7] 05/03/2015  . Chronic pain associated with significant psychosocial dysfunction [G89.4] 05/02/2015  . Anxiety [F41.9] 02/27/2015  . Blush [R23.2] 09/21/2013  . Spasm [R25.2] 09/21/2013  . Infrequent menses [N91.5] 09/21/2013  . Pelvic pain [R10.2] 06/15/2013  . Pelvic and perineal pain [R10.2] 06/15/2013  . Chronic female pelvic pain [N94.9, G89.29] 08/14/2012  . Syncope [R55] 02/11/2012   History of Present Illness:    She is a 25 year old female who presented for follow-up appointment. She reported that she has recently broken up with her boyfriend as they were having some relationship issues. She reported that it was a mutual decision. He is still living with her. She reported that she was upset with her boyfriend as he did not have a job for several months. They were together for the past 8 years. She reported that she is looking for a job and has applied for the same. She reported that she was taking Lyrica for her chronic pain but was losing hair,  so she has recently stopped taking the medication. She was tried [place] but  was unable to fill the prescription due to insurance problems. She also tried [place] but started having nosebleeds. She reported that she is very sensitive to the medications. Patient reported that she wants to try medications to help with the sleep as she is having problems with insomnia at this time. Patient reported that she wants to try a very small dose of the medications as she is very  sensitive to the medications.. Patient stated that she feels sad most of the time. She currently denied having any suicidal homicidal ideations or plans       Past Medical History:  Past Medical History  Diagnosis Date  . Chronic pelvic pain in female   . Streak gonad     Left  . Anxiety   . Depression   . Fibromyalgia     Past Surgical History  Procedure Laterality Date  . Salpingoophorectomy    . Laparotomy    . Abdominal hysterectomy     Family History:  Family History  Problem Relation Age of Onset  . Hypertension Other     Malignant  . Diverticulitis Mother   . Hypertension Mother   . Cancer Paternal Grandmother   . Post-traumatic stress disorder Father   . Mental illness Brother   . Depression Sister    Social History:   Social History   Social History  . Marital Status: Single    Spouse Name: N/A  . Number of Children: N/A  . Years of Education: N/A   Social History Main Topics  . Smoking status: Never Smoker   . Smokeless tobacco: Never Used  . Alcohol Use: 0.0 - 1.2 oz/week    0-1 Glasses of wine, 0 Cans of beer, 0-1 Shots of liquor per week     Comment: occasional  . Drug Use: No  . Sexual Activity:    Partners: Male    Copy: None   Other  Topics Concern  . None   Social History Narrative   Additional Social History:  Looking for a job.  Musculoskeletal: Strength & Muscle Tone: within normal limits Gait & Station: normal Patient leans: N/A  Psychiatric Specialty Exam: Anxiety      ROS   Blood pressure 120/80, pulse 108, temperature 98.9 F (37.2 C), temperature source Tympanic, height  (1.6 m), weight 113 lb 9.6 oz (51.529 kg), last menstrual period 06/18/2015, SpO2 99 %.Body mass index is 20.13 kg/(m^2).  General Appearance: Fairly Groomed  Eye Contact:  Fair  Speech:  Clear and Coherent  Volume:  Normal  Mood:  Depressed  Affect:  Congruent  Thought Process:  Coherent and Intact  Orientation:   Full (Time, Place, and Person)  Thought Content:  WDL  Suicidal Thoughts:  No  Homicidal Thoughts:  No  Memory:  Immediate;   Fair  Judgement:  Fair  Insight:  Lacking  Psychomotor Activity:  Normal  Concentration:  Fair  Recall:  [employer] of Knowledge:Fair  Language: Fair  Akathisia:  No  Handed:  Right  AIMS (if indicated):    Assets:  Communication Skills Desire for Improvement Housing Social Support  ADL's:  Intact  Cognition: WNL  Sleep:  8   Is the patient at risk to self?  No. Has the patient been a risk to self in the past 6 months?  No. Has the patient been a risk to self within the distant past?  No. Is the patient a risk to others?  No. Has the patient been a risk to others in the past 6 months?  No. Has the patient been a risk to others within the distant past?  No.  Allergies:   Allergies  Allergen Reactions  . Adhesive [Tape] Rash and Hives    Reaction to adhesive bandage after surgery Reaction to adhesive bandage after surgery  . Gabapentin Other (See Comments)    Increased anxiety.  Did things she doesn't remember doing. Other reaction(s): Other Increased anxiety.Did things she doesn't remember doing.  Ofilia Neas [Oxycodone-Acetaminophen] Nausea And Vomiting  . Vicodin [Hydrocodone-Acetaminophen] Nausea And Vomiting  . Other Hives    Reaction to adhesive bandage after surgery   Current Medications: Current Outpatient Prescriptions  Medication Sig Dispense Refill  . FLUoxetine (PROZAC) 10 MG capsule Take 1 capsule (10 mg total) by mouth daily. 90 capsule 3  . FLUoxetine (PROZAC) 20 MG capsule Take 1 capsule (20 mg total) by mouth daily. 90 capsule 3  . ondansetron (ZOFRAN) 8 MG tablet   0   No current facility-administered medications for this visit.    Previous Psychotropic Medications: Gabapentin, Zoloft. She is doing well on Prozac. Lyrica, savella, lunesta    Substance Abuse History in the last 12 months:  No.  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  Medication management   Discussed with patient about the medications treatment risks benefits and alternatives  Depression Continue Prozac to 30 mg by mouth daily- she was given 90 day supply of both Prozac 20 mg and 10 mg daily.  Anxiety She does not take Xanax on a regular basis. She has supply.   She will be started on trazodone 50 mg daily at bedtime when necessary for insomnia. Discussed with her about  the side effects and she demonstrated understanding.   Follow-up in one month or earlier   More than 50% of the time spent in psychoeducation, counseling and coordination of care.    This note was generated in part or whole with voice recognition software. Voice regonition is usually quite accurate but there are transcription errors that can and very often do occur. I apologize for any typographical errors that were not detected and corrected.   Brandy Hale, MD    3/2/20179:20 AM

## 2015-08-15 ENCOUNTER — Ambulatory Visit (INDEPENDENT_AMBULATORY_CARE_PROVIDER_SITE_OTHER): Payer: Self-pay | Admitting: Psychiatry

## 2015-08-16 ENCOUNTER — Encounter: Payer: Self-pay | Admitting: Psychiatry

## 2015-08-16 ENCOUNTER — Ambulatory Visit (INDEPENDENT_AMBULATORY_CARE_PROVIDER_SITE_OTHER): Payer: BLUE CROSS/BLUE SHIELD | Admitting: Psychiatry

## 2015-08-16 VITALS — BP 122/80 | HR 104 | Temp 98.2°F | Ht 63.0 in | Wt 116.6 lb

## 2015-08-16 DIAGNOSIS — F450 Somatization disorder: Secondary | ICD-10-CM

## 2015-08-16 DIAGNOSIS — F4323 Adjustment disorder with mixed anxiety and depressed mood: Secondary | ICD-10-CM | POA: Diagnosis not present

## 2015-08-16 DIAGNOSIS — F32A Depression, unspecified: Secondary | ICD-10-CM

## 2015-08-16 DIAGNOSIS — F329 Major depressive disorder, single episode, unspecified: Secondary | ICD-10-CM | POA: Diagnosis not present

## 2015-08-16 MED ORDER — TRAZODONE HCL 50 MG PO TABS: 50.0000 mg | ORAL_TABLET | Freq: Every day | ORAL | Status: DC

## 2015-08-16 MED ORDER — FLUOXETINE HCL 40 MG PO CAPS: 40.0000 mg | ORAL_CAPSULE | Freq: Every day | ORAL | Status: DC

## 2015-08-16 NOTE — Progress Notes (Signed)
Psychiatric Follow up MD/NP Note   Patient Identification: Amber Hancock MRN:  [id number] Date of Evaluation:  08/16/2015 Referral Source: Referral from PCP- [facility]  Chief Complaint:   Chief Complaint    Follow-up; Medication Refill     Visit Diagnosis:    ICD-9-CM ICD-10-CM   1. 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 Adjustment disorder with mixed anxiety and depressed mood 309.28 F43.23    Diagnosis:   Patient Active Problem List   Diagnosis Date Noted  . Fibromyalgia [M79.7] 05/03/2015  . Chronic pain associated with significant psychosocial dysfunction [G89.4] 05/02/2015  . Anxiety [F41.9] 02/27/2015  . Blush [R23.2] 09/21/2013  . Spasm [R25.2] 09/21/2013  . Infrequent menses [N91.5] 09/21/2013  . Pelvic pain [R10.2] 06/15/2013  . Pelvic and perineal pain [R10.2] 06/15/2013  . Chronic female pelvic pain [N94.9, G89.29] 08/14/2012  . Syncope [R55] 02/11/2012   History of Present Illness:    She is a 25 year old single  female who presented for follow-up appointment. 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 She reported that it will be for 3 months. Patient reported that she is noticing some improvement in her anxiety as she has been taking the Prozac on a regular basis. Patient reported that she was having issues with her insurance and pharmacy as they were not filling her Prozac 10 mg pills on a regular basis and she has been running out of the medication. She is interested in going higher on the dose of the medication. We she reported that she continues to take Prozac on a regular basis. She also takes Xanax on a when necessary basis but it day makes her very tired and sleepy and she does not like taking the medication. She reported that she is still recovering from the breakup off the boyfriend and he is planning to relocate from her home. She has given him the  notice and he will probably leave her house next month. She has surrounded herself with a new group of friends and is trying to be more positive. She appeared more calm and alert during the interview. She reported that she is doing well on her current combination of medication and is sleeping well with the help of trazodone 25 mg at bedtime   Patient currently denied having any suicidal homicidal ideations or plans     Past Medical History:  Past Medical History  Diagnosis Date  . Chronic pelvic pain in female   . Streak gonad     Left  . Anxiety   . Depression   . Fibromyalgia     Past Surgical History  Procedure Laterality Date  . Salpingoophorectomy    . Laparotomy    . Abdominal hysterectomy     Family History:  Family History  Problem Relation Age of Onset  . Hypertension Other     Malignant  . Diverticulitis Mother   . Hypertension Mother   . Cancer Paternal Grandmother   . Post-traumatic stress disorder Father   . Mental illness Brother   . Depression Sister    Social History:   Social History   Social History  . Marital Status: Single    Spouse Name: N/A  . Number of Children: N/A  . Years of Education: N/A   Social History Main Topics  . Smoking status: Never Smoker   . Smokeless tobacco: Never Used  . Alcohol Use:  0.0 - 1.2 oz/week    0-1 Glasses of wine, 0 Cans of beer, 0-1 Shots of liquor per week     Comment: occasional  . Drug Use: No  . Sexual Activity:    Partners: Male    Pharmacist, hospital Protection: None   Other Topics Concern  . None   Social History Narrative   Additional Social History:  Looking for a job.  Musculoskeletal: Strength & Muscle Tone: within normal limits Gait & Station: normal Patient leans: N/A  Psychiatric Specialty Exam: Anxiety      ROS  Blood pressure 122/80, pulse 104, temperature 98.2 F (36.8 C), temperature source Tympanic, height  (1.6 m), weight 116 lb 9.6 oz (52.889 kg), last menstrual  period 07/18/2015, SpO2 98 %.Body mass index is 20.66 kg/(m^2).  General Appearance: Fairly Groomed  Eye Contact:  Fair  Speech:  Clear and Coherent  Volume:  Normal  Mood:  Depressed  Affect:  Congruent  Thought Process:  Coherent and Intact  Orientation:  Full (Time, Place, and Person)  Thought Content:  WDL  Suicidal Thoughts:  No  Homicidal Thoughts:  No  Memory:  Immediate;   Fair  Judgement:  Fair  Insight:  Lacking  Psychomotor Activity:  Normal  Concentration:  Fair  Recall:  [employer] of Knowledge:Fair  Language: Fair  Akathisia:  No  Handed:  Right  AIMS (if indicated):    Assets:  Communication Skills Desire for Improvement Housing Social Support  ADL's:  Intact  Cognition: WNL  Sleep:  8   Is the patient at risk to self?  No. Has the patient been a risk to self in the past 6 months?  No. Has the patient been a risk to self within the distant past?  No. Is the patient a risk to others?  No. Has the patient been a risk to others in the past 6 months?  No. Has the patient been a risk to others within the distant past?  No.  Allergies:   Allergies  Allergen Reactions  . Adhesive [Tape] Rash and Hives    Reaction to adhesive bandage after surgery Reaction to adhesive bandage after surgery  . Gabapentin Other (See Comments)    Increased anxiety.  Did things she doesn't remember doing. Other reaction(s): Other Increased anxiety.Did things she doesn't remember doing.  Ofilia Neas [Oxycodone-Acetaminophen] Nausea And Vomiting  . Vicodin [Hydrocodone-Acetaminophen] Nausea And Vomiting  . Other Hives    Reaction to adhesive bandage after surgery   Current Medications: Current Outpatient Prescriptions  Medication Sig Dispense Refill  . FLUoxetine (PROZAC) 10 MG capsule TAKE ONE CAPSULE BY MOUTH ONCE DAILY 30 capsule 0  . FLUoxetine (PROZAC) 20 MG capsule TAKE ONE CAPSULE BY MOUTH ONCE DAILY 30 capsule 0  . ondansetron (ZOFRAN) 8 MG tablet   0  . traZODone  (DESYREL) 50 MG tablet Take 1 tablet (50 mg total) by mouth at bedtime. 30 tablet 1   No current facility-administered medications for this visit.    Previous Psychotropic Medications: Gabapentin, Zoloft. She is doing well on Prozac. Lyrica, savella, lunesta    Substance Abuse History in the last 12 months:  No.  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  Medication management   Discussed with patient about the  medications treatment risks benefits and alternatives  Depression Continue Prozac to 40 mg po qam  Anxiety She does not take Xanax on a regular basis. She has supply.   She will continue  on trazodone 25 mg daily at bedtime when necessary for insomnia. Discussed with her about the side effects and she demonstrated understanding.   Follow-up in one month or earlier   More than 50% of the time spent in psychoeducation, counseling and coordination of care.    This note was generated in part or whole with voice recognition software. Voice regonition is usually quite accurate but there are transcription errors that can and very often do occur. I apologize for any typographical errors that were not detected and corrected.   Brandy HaleUzma Takuma Cifelli, MD    4/6/20174:22 PM

## 2015-09-13 ENCOUNTER — Ambulatory Visit (INDEPENDENT_AMBULATORY_CARE_PROVIDER_SITE_OTHER): Payer: Self-pay | Admitting: Psychiatry

## 2015-10-01 ENCOUNTER — Ambulatory Visit (INDEPENDENT_AMBULATORY_CARE_PROVIDER_SITE_OTHER): Payer: BLUE CROSS/BLUE SHIELD | Admitting: Licensed Clinical Social Worker

## 2015-10-01 DIAGNOSIS — F329 Major depressive disorder, single episode, unspecified: Secondary | ICD-10-CM

## 2015-10-01 DIAGNOSIS — F450 Somatization disorder: Secondary | ICD-10-CM

## 2015-10-03 NOTE — Progress Notes (Signed)
   THERAPIST PROGRESS NOTE  Session Time: 60min  Participation Level: Active  Behavioral Response: Neat and Well GroomedAlertDepressed  Type of Therapy: Individual Therapy  Treatment Goals addressed: Coping and Diagnosis: Adjustment Disorder  Interventions: CBT, Motivational Interviewing, Solution Focused, Supportive, Family Systems and Reframing  Summary: Amber Hancock is a 25 y.o. female who presents with symptoms of her diagnosis.  Discussion of current and history of her symptoms.  Changes since last session.  Discussion of her change in hair color and make up.  Discussion of relationships, break ups and how to keep moving forward.  Reviewed her treatment plan and changes that have taken place.   Suicidal/Homicidal: Nowithout intent/plan  Therapist Response: LCSW provided Patient with ongoing emotional support and encouragement.  Normalized her feelings.  Commended Patient on her progress and reinforced the importance of client staying focused on her own strengths and resources and resiliency. Processed various strategies for dealing with stressors.    Plan: Return again in 2 weeks.  Diagnosis: Axis I: Depression    Axis II: No diagnosis    Marinda Elkicole M Bernadette Gores, LCSW 10/01/2015

## 2015-10-11 ENCOUNTER — Encounter: Payer: Self-pay | Admitting: Psychiatry

## 2015-10-11 ENCOUNTER — Ambulatory Visit (INDEPENDENT_AMBULATORY_CARE_PROVIDER_SITE_OTHER): Payer: BLUE CROSS/BLUE SHIELD | Admitting: Psychiatry

## 2015-10-11 VITALS — BP 122/78 | HR 94 | Temp 97.8°F | Ht 63.0 in | Wt 120.4 lb

## 2015-10-11 DIAGNOSIS — F329 Major depressive disorder, single episode, unspecified: Secondary | ICD-10-CM

## 2015-10-11 DIAGNOSIS — F4323 Adjustment disorder with mixed anxiety and depressed mood: Secondary | ICD-10-CM

## 2015-10-11 DIAGNOSIS — F450 Somatization disorder: Secondary | ICD-10-CM

## 2015-10-11 DIAGNOSIS — F32A Depression, unspecified: Secondary | ICD-10-CM

## 2015-10-11 MED ORDER — FLUOXETINE HCL 40 MG PO CAPS: 40.0000 mg | ORAL_CAPSULE | Freq: Every day | ORAL | Status: DC

## 2015-10-11 MED ORDER — TRAZODONE HCL 50 MG PO TABS: 50.0000 mg | ORAL_TABLET | Freq: Every day | ORAL | Status: DC

## 2015-10-11 NOTE — Progress Notes (Signed)
Psychiatric Follow up MD/NP Note   Patient Identification: Amber Hancock MRN:  [id number] Date of Evaluation:  10/11/2015 Referral Source: Referral from PCP- [facility]  Chief Complaint:   Chief Complaint    Follow-up; Medication Refill     Visit Diagnosis:    ICD-9-CM ICD-10-CM   1. Adjustment disorder with mixed anxiety and depressed mood 309.28 F43.23   2. 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 Fibromyalgia [M79.7] 05/03/2015  . Chronic pain associated with significant psychosocial dysfunction [G89.4] 05/02/2015  . Anxiety [F41.9] 02/27/2015  . Blush [R23.2] 09/21/2013  . Spasm [R25.2] 09/21/2013  . Infrequent menses [N91.5] 09/21/2013  . Pelvic pain [R10.2] 06/15/2013  . Pelvic and perineal pain [R10.2] 06/15/2013  . Chronic female pelvic pain [N94.9, G89.29] 08/14/2012  . Syncope [R55] 02/11/2012   History of Present Illness:    She is a 25 year old single  female who presented for follow-up appointment. She reported that she has accepted a position with [employer] And is working as an Tax inspectorintern. Patient reported that she has been feeling better on her medications. She has also started therapy with Joni ReiningNicole on a regular basis. Patient reported that she feels some dissociative symptoms and has been discussing with her therapist on a regular basis. Patient currently denied having any suicidal ideations or plans. She reported that the medications are helping her and she is becoming more calm and her depression is improving. She has a new relationship and it is bringing her dissociative symptoms back. She was able to relate and described her symptoms in detail. However she did not have a panic attack. Patient stated that she has been sleeping well at night. 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   She is trying to be  more positive. Patient currently denied having any suicidal homicidal ideations or plans     Past Medical History:  Past Medical History  Diagnosis Date  . Chronic pelvic pain in female   . Streak gonad     Left  . Anxiety   . Depression   . Fibromyalgia     Past Surgical History  Procedure Laterality Date  . Salpingoophorectomy    . Laparotomy    . Abdominal hysterectomy     Family History:  Family History  Problem Relation Age of Onset  . Hypertension Other     Malignant  . Diverticulitis Mother   . Hypertension Mother   . Cancer Paternal Grandmother   . Post-traumatic stress disorder Father   . Mental illness Brother   . Depression Sister    Social History:   Social History   Social History  . Marital Status: Single    Spouse Name: N/A  . Number of Children: N/A  . Years of Education: N/A   Social History Main Topics  . Smoking status: Never Smoker   . Smokeless tobacco: Never Used  . Alcohol Use: 0.0 - 1.2 oz/week    0-1 Glasses of wine, 0 Cans of beer, 0-1 Shots of liquor per week     Comment: occasional  . Drug Use: No  . Sexual Activity:    Partners: Male    Pharmacist, hospitalBirth Control/ Protection: None   Other Topics Concern  . None   Social History Narrative   Additional Social History:  Looking for a job.  Musculoskeletal: Strength & Muscle Tone: within normal limits Gait & Station: normal Patient leans: N/A  Psychiatric Specialty Exam: Anxiety      ROS  Blood pressure 122/78, pulse 94, temperature 97.8 F (36.6 C), temperature source Tympanic, height  (1.6 m), weight 120 lb 6.4 oz (54.613 kg), last menstrual period 09/17/2015, SpO2 97 %.Body mass index is 21.33 kg/(m^2).  General Appearance: Fairly Groomed  Eye Contact:  Fair  Speech:  Clear and Coherent  Volume:  Normal  Mood:  Depressed  Affect:  Congruent  Thought Process:  Coherent and Intact  Orientation:  Full (Time, Place, and Person)  Thought Content:  WDL  Suicidal  Thoughts:  No  Homicidal Thoughts:  No  Memory:  Immediate;   Fair  Judgement:  Fair  Insight:  Lacking  Psychomotor Activity:  Normal  Concentration:  Fair  Recall:  [employer] of Knowledge:Fair  Language: Fair  Akathisia:  No  Handed:  Right  AIMS (if indicated):    Assets:  Communication Skills Desire for Improvement Housing Social Support  ADL's:  Intact  Cognition: WNL  Sleep:  8   Is the patient at risk to self?  No. Has the patient been a risk to self in the past 6 months?  No. Has the patient been a risk to self within the distant past?  No. Is the patient a risk to others?  No. Has the patient been a risk to others in the past 6 months?  No. Has the patient been a risk to others within the distant past?  No.  Allergies:   Allergies  Allergen Reactions  . Adhesive [Tape] Rash and Hives    Reaction to adhesive bandage after surgery Reaction to adhesive bandage after surgery  . Gabapentin Other (See Comments)    Increased anxiety.  Did things she doesn't remember doing. Other reaction(s): Other Increased anxiety.Did things she doesn't remember doing.  Ofilia Neas [Oxycodone-Acetaminophen] Nausea And Vomiting  . Vicodin [Hydrocodone-Acetaminophen] Nausea And Vomiting  . Other Hives    Reaction to adhesive bandage after surgery   Current Medications: Current Outpatient Prescriptions  Medication Sig Dispense Refill  . FLUoxetine (PROZAC) 40 MG capsule Take 1 capsule (40 mg total) by mouth daily. 30 capsule 1  . ondansetron (ZOFRAN) 8 MG tablet   0  . traZODone (DESYREL) 50 MG tablet Take 1 tablet (50 mg total) by mouth at bedtime. 30 tablet 1   No current facility-administered medications for this visit.    Previous Psychotropic Medications: Gabapentin, Zoloft. She is doing well on Prozac. Lyrica, savella, lunesta    Substance Abuse History in the last 12 months:  No.  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  Medication management   Discussed with patient about the medications treatment risks benefits and alternatives  Depression Continue Prozac to 40 mg po qam  Anxiety She does not take Xanax on a regular basis. She has supply.   She will continue  on trazodone 25 mg daily at bedtime when necessary for insomnia. Discussed with her about the side effects and she demonstrated understanding.   Follow-up in 2 month or earlier   More than 50% of the time spent in psychoeducation, counseling and coordination of care.  This note was generated in part or whole with voice recognition software. Voice regonition is usually quite accurate but there are transcription errors that can and very often do occur. I apologize for any typographical errors that were not detected and corrected.   Brandy Hale, MD    6/1/201710:10 AM

## 2015-11-01 ENCOUNTER — Ambulatory Visit: Payer: BLUE CROSS/BLUE SHIELD | Admitting: Licensed Clinical Social Worker

## 2015-11-12 ENCOUNTER — Ambulatory Visit (INDEPENDENT_AMBULATORY_CARE_PROVIDER_SITE_OTHER): Payer: BLUE CROSS/BLUE SHIELD | Admitting: Licensed Clinical Social Worker

## 2015-11-12 DIAGNOSIS — F450 Somatization disorder: Secondary | ICD-10-CM | POA: Diagnosis not present

## 2015-11-12 DIAGNOSIS — F329 Major depressive disorder, single episode, unspecified: Secondary | ICD-10-CM | POA: Diagnosis not present

## 2015-11-22 ENCOUNTER — Telehealth (HOSPITAL_COMMUNITY): Payer: Self-pay

## 2015-11-27 ENCOUNTER — Encounter: Payer: Self-pay | Admitting: Psychiatry

## 2015-11-27 ENCOUNTER — Ambulatory Visit (INDEPENDENT_AMBULATORY_CARE_PROVIDER_SITE_OTHER): Payer: BLUE CROSS/BLUE SHIELD | Admitting: Psychiatry

## 2015-11-27 VITALS — BP 122/82 | HR 101 | Temp 97.7°F | Ht 63.0 in | Wt 122.2 lb

## 2015-11-27 DIAGNOSIS — F32A Depression, unspecified: Secondary | ICD-10-CM

## 2015-11-27 DIAGNOSIS — F4323 Adjustment disorder with mixed anxiety and depressed mood: Secondary | ICD-10-CM | POA: Diagnosis not present

## 2015-11-27 DIAGNOSIS — F329 Major depressive disorder, single episode, unspecified: Secondary | ICD-10-CM | POA: Diagnosis not present

## 2015-11-27 DIAGNOSIS — F450 Somatization disorder: Secondary | ICD-10-CM

## 2015-11-27 MED ORDER — LAMOTRIGINE 25 MG PO TABS: 25.0000 mg | ORAL_TABLET | Freq: Every day | ORAL | Status: DC

## 2015-11-27 MED ORDER — TRAZODONE HCL 50 MG PO TABS: 50.0000 mg | ORAL_TABLET | Freq: Every day | ORAL | Status: DC

## 2015-11-27 MED ORDER — FLUOXETINE HCL 20 MG PO TABS: 20.0000 mg | ORAL_TABLET | Freq: Every day | ORAL | Status: DC

## 2015-11-27 NOTE — Telephone Encounter (Signed)
Pt had appt today.

## 2015-11-27 NOTE — Progress Notes (Signed)
Psychiatric Follow up MD/NP Note   Patient Identification: Amber Hancock MRN:  [id number] Date of Evaluation:  11/27/2015 Referral Source: Referral from PCP- [facility]  Chief Complaint:   Chief Complaint    Follow-up; Medication Refill     Visit Diagnosis:    ICD-9-CM ICD-10-CM   1. Adjustment disorder with mixed anxiety and depressed mood 309.28 F43.23   2. 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 Fibromyalgia [M79.7] 05/03/2015  . Chronic pain associated with significant psychosocial dysfunction [G89.4] 05/02/2015  . Anxiety [F41.9] 02/27/2015  . Blush [R23.2] 09/21/2013  . Spasm [R25.2] 09/21/2013  . Infrequent menses [N91.5] 09/21/2013  . Pelvic pain [R10.2] 06/15/2013  . Pelvic and perineal pain [R10.2] 06/15/2013  . Chronic female pelvic pain [N94.9, G89.29] 08/14/2012  . Syncope [R55] 02/11/2012   History of Present Illness:    She is a 25 year old single  female who presented for follow-up appointment. She reported that She had a stressful time at her birthday when she was dumped by her current boyfriend. She reported that she was able to accept that and move forward. She appeared anxious during the interview. She reported that she was having increased anxiety with the Prozac 40 mg and now she is decrease the dose to 20 mg. She is going for a mood stabilizer at this time. She reported that she has discussed with her mother who works as a Paramedictherapist and they have discussed about adding a mood stabilizer to her medications. She is currently working as a Company secretaryfreelancer. She reported that she was unable to sustain her job. She is also taking trazodone 25 mg at bedtime to help her sleep. She currently denied having any suicidal homicidal ideations or plans. She reported that she continues to have dissociative symptoms. She appeared calm and alert during the interview.   Past Medical History:   Past Medical History  Diagnosis Date  . Chronic pelvic pain in female   . Streak gonad     Left  . Anxiety   . Depression   . Fibromyalgia     Past Surgical History  Procedure Laterality Date  . Salpingoophorectomy    . Laparotomy    . Abdominal hysterectomy     Family History:  Family History  Problem Relation Age of Onset  . Hypertension Other     Malignant  . Diverticulitis Mother   . Hypertension Mother   . Cancer Paternal Grandmother   . Post-traumatic stress disorder Father   . Mental illness Brother   . Depression Sister    Social History:   Social History   Social History  . Marital Status: Single    Spouse Name: N/A  . Number of Children: N/A  . Years of Education: N/A   Social History Main Topics  . Smoking status: Never Smoker   . Smokeless tobacco: Never Used  . Alcohol Use: 0.0 - 1.2 oz/week    0-1 Glasses of wine, 0 Cans of beer, 0-1 Shots of liquor per week     Comment: occasional  . Drug Use: No  . Sexual Activity:    Partners: Male    Pharmacist, hospitalBirth Control/ Protection: None   Other Topics Concern  . None   Social History Narrative   Additional Social History:  Looking for a job.  Musculoskeletal: Strength & Muscle Tone: within normal limits Gait & Station: normal Patient leans: N/A  Psychiatric Specialty Exam: Anxiety      ROS  Blood pressure 122/82, pulse 101, temperature 97.7 F (36.5 C), temperature source Tympanic, height  (1.6 m), weight 122 lb 3.2 oz (55.43 kg), last menstrual period 11/16/2015, SpO2 98 %.Body mass index is 21.65 kg/(m^2).  General Appearance: Fairly Groomed  Eye Contact:  Fair  Speech:  Clear and Coherent  Volume:  Normal  Mood:  Depressed  Affect:  Congruent  Thought Process:  Coherent and Intact  Orientation:  Full (Time, Place, and Person)  Thought Content:  WDL  Suicidal Thoughts:  No  Homicidal Thoughts:  No  Memory:  Immediate;   Fair  Judgement:  Fair  Insight:  Lacking  Psychomotor  Activity:  Normal  Concentration:  Fair  Recall:  [employer] of Knowledge:Fair  Language: Fair  Akathisia:  No  Handed:  Right  AIMS (if indicated):    Assets:  Communication Skills Desire for Improvement Housing Social Support  ADL's:  Intact  Cognition: WNL  Sleep:  8   Is the patient at risk to self?  No. Has the patient been a risk to self in the past 6 months?  No. Has the patient been a risk to self within the distant past?  No. Is the patient a risk to others?  No. Has the patient been a risk to others in the past 6 months?  No. Has the patient been a risk to others within the distant past?  No.  Allergies:   Allergies  Allergen Reactions  . Adhesive [Tape] Rash and Hives    Reaction to adhesive bandage after surgery Reaction to adhesive bandage after surgery  . Gabapentin Other (See Comments)    Increased anxiety.  Did things she doesn't remember doing. Other reaction(s): Other Increased anxiety.Did things she doesn't remember doing.  . Oxycodone-Acetaminophen Nausea And Vomiting  . Tylox [Oxycodone-Acetaminophen] Nausea And Vomiting  . Vicodin [Hydrocodone-Acetaminophen] Nausea And Vomiting  . Other Hives    Reaction to adhesive bandage after surgery   Current Medications: Current Outpatient Prescriptions  Medication Sig Dispense Refill  . ondansetron (ZOFRAN) 8 MG tablet   0  . traZODone (DESYREL) 50 MG tablet Take 1 tablet (50 mg total) by mouth at bedtime. 30 tablet 1  . FLUoxetine (PROZAC) 40 MG capsule Take 1 capsule (40 mg total) by mouth daily. (Patient not taking: Reported on 11/27/2015) 30 capsule 1   No current facility-administered medications for this visit.    Previous Psychotropic Medications: Gabapentin, Zoloft. She is doing well on Prozac. Lyrica, savella, lunesta    Substance Abuse History in the last 12 months:  No.  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  Medication management   Discussed with patient about the medications treatment risks benefits and alternatives  Depression Continue Prozac to 20 mg po qam  Start lamotrigine 25 mg by mouth daily just. Discussed with patient what the side effects of medication detail including the risk of rash and Trudie Buckler syndrome and she demonstrated understanding  She will continue  on trazodone 25 mg daily at bedtime when necessary for insomnia. Discussed with her about the side effects and she demonstrated understanding.   Follow-up in 1 month    More than 50% of the time spent in  psychoeducation, counseling and coordination of care.    This note was generated in part or whole with voice recognition software. Voice regonition is usually quite accurate but there are transcription errors that can and very often do occur. I apologize for any typographical errors that were not detected and corrected.   Brandy Hale, MD    7/18/20172:16 PM

## 2015-11-29 NOTE — Progress Notes (Signed)
   THERAPIST PROGRESS NOTE  Session Time: 60min  Participation Level: Active  Behavioral Response: CasualAlertDepressed  Type of Therapy: Individual Therapy  Treatment Goals addressed: Coping and Diagnosis: Depression  Interventions: CBT, Motivational Interviewing, Solution Focused, Strength-based, Supportive, Family Systems and Reframing  Summary: Amber Hancock is a 25 y.o. female who presents with continued symptoms of her diagnosis.  She was able to vent her frustration since her previous session.  She continues to have relationship difficulty.  She was able to express why she and her current boyfriend broke up and how she is now having feelings for her ex boyfriend.  She states that she wants to take time to herself to ensure she is making good decisions for her future. She reports that she has used coping skills previous taught and is making small steps toward progress.  She continues to have physical symptoms that are unexplained.  Suicidal/Homicidal: Nowithout intent/plan  Therapist Response: LCSW provided Patient with ongoing emotional support and encouragement.  Normalized her feelings.  Commended Patient on her progress and reinforced the importance of client staying focused on her own strengths and resources and resiliency. Processed various strategies for dealing with stressors.    Plan: Return again in 2 weeks.  Diagnosis: Axis I: Depression    Axis II: No diagnosis    Amber Elkicole M Darene Nappi, LCSW 11/12/2015

## 2015-12-11 ENCOUNTER — Ambulatory Visit: Payer: No Typology Code available for payment source | Admitting: Psychiatry

## 2015-12-13 ENCOUNTER — Ambulatory Visit: Payer: BLUE CROSS/BLUE SHIELD | Admitting: Licensed Clinical Social Worker

## 2015-12-13 ENCOUNTER — Ambulatory Visit: Payer: BLUE CROSS/BLUE SHIELD | Admitting: Psychiatry

## 2015-12-24 ENCOUNTER — Encounter: Payer: Self-pay | Admitting: Psychiatry

## 2015-12-24 ENCOUNTER — Ambulatory Visit (INDEPENDENT_AMBULATORY_CARE_PROVIDER_SITE_OTHER): Payer: BLUE CROSS/BLUE SHIELD | Admitting: Psychiatry

## 2015-12-24 ENCOUNTER — Ambulatory Visit (INDEPENDENT_AMBULATORY_CARE_PROVIDER_SITE_OTHER): Payer: BLUE CROSS/BLUE SHIELD | Admitting: Licensed Clinical Social Worker

## 2015-12-24 VITALS — BP 113/80 | HR 64 | Temp 98.6°F | Ht 63.0 in | Wt 122.2 lb

## 2015-12-24 DIAGNOSIS — F450 Somatization disorder: Secondary | ICD-10-CM

## 2015-12-24 DIAGNOSIS — F329 Major depressive disorder, single episode, unspecified: Secondary | ICD-10-CM | POA: Diagnosis not present

## 2015-12-24 DIAGNOSIS — F32A Depression, unspecified: Secondary | ICD-10-CM

## 2015-12-24 DIAGNOSIS — F4323 Adjustment disorder with mixed anxiety and depressed mood: Secondary | ICD-10-CM | POA: Diagnosis not present

## 2015-12-24 MED ORDER — TRAZODONE HCL 50 MG PO TABS: 50.0000 mg | ORAL_TABLET | Freq: Every day | ORAL | 1 refills | Status: DC

## 2015-12-24 MED ORDER — LAMOTRIGINE 25 MG PO TABS: 25.0000 mg | ORAL_TABLET | Freq: Every day | ORAL | 1 refills | Status: DC

## 2015-12-24 MED ORDER — FLUOXETINE HCL 20 MG PO TABS: 20.0000 mg | ORAL_TABLET | Freq: Every day | ORAL | 1 refills | Status: DC

## 2015-12-24 NOTE — Progress Notes (Signed)
Psychiatric Follow up MD/NP Note   Patient Identification: Amber Hancock MRN:  [id number] Date of Evaluation:  12/24/2015 Referral Source: Referral from PCP- [facility]  Chief Complaint:   Chief Complaint    Follow-up; Medication Problem; Medication Refill     Visit Diagnosis:    ICD-9-CM ICD-10-CM   1. 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 Adjustment disorder with mixed anxiety and depressed mood 309.28 F43.23    Diagnosis:   Patient Active Problem List   Diagnosis Date Noted  . Fibromyalgia [M79.7] 05/03/2015  . Chronic pain associated with significant psychosocial dysfunction [G89.4] 05/02/2015  . Anxiety [F41.9] 02/27/2015  . Blush [R23.2] 09/21/2013  . Spasm [R25.2] 09/21/2013  . Infrequent menses [N91.5] 09/21/2013  . Pelvic pain [R10.2] 06/15/2013  . Pelvic and perineal pain [R10.2] 06/15/2013  . Chronic female pelvic pain [N94.9, G89.29] 08/14/2012  . Syncope [R55] 02/11/2012   History of Present Illness:    Pt  is a 25 year old single  female who presented for follow-up appointment. She reported that She was unable to fill her prescription of Prozac since her last appointment as it was very expensive and she had to be $32. Asian reported that her mother has been calling around [employer] insurance company and she was unable to afford the medication. She was feeling very depressed hopeless and not feeling well. She continued to take the lamotrigine but it was not helpful. Patient reported that now she has received a call from the pharmacy that the medication is ready and she only has to pay a dollars. She reported that she is building up her resume so she can apply for her and her job. She appeared tired during the interview. She currently denied having any suicidal ideations or plans. She takes trazodone on a when necessary basis to help her sleep.      Past Medical History:  Past Medical History:  Diagnosis Date  . Anxiety   . Chronic pelvic pain  in female   . Depression   . Fibromyalgia   . Streak gonad    Left    Past Surgical History:  Procedure Laterality Date  . ABDOMINAL HYSTERECTOMY    . LAPAROTOMY    . SALPINGOOPHORECTOMY     Family History:  Family History  Problem Relation Age of Onset  . Hypertension Other     Malignant  . Diverticulitis Mother   . Hypertension Mother   . Cancer Paternal Grandmother   . Post-traumatic stress disorder Father   . Mental illness Brother   . Depression Sister    Social History:   Social History   Social History  . Marital status: Single    Spouse name: N/A  . Number of children: N/A  . Years of education: N/A   Social History Main Topics  . Smoking status: Never Smoker  . Smokeless tobacco: Never Used  . Alcohol use 0.0 - 1.2 oz/week     Comment: occasional  . Drug use: No  . Sexual activity: Yes    Partners: Male    Birth control/ protection: None   Other Topics Concern  . None   Social History Narrative  . None   Additional Social History:  Looking for a job.  Musculoskeletal: Strength & Muscle Tone: within normal limits Gait & Station: normal Patient leans: N/A  Psychiatric Specialty Exam: Anxiety     Medication Refill     ROS  Blood pressure 113/80, pulse 64, temperature 98.6  F (37 C), temperature source Oral, height 5\' 3"  (1.6 m), weight 122 lb 3.2 oz (55.4 kg), last menstrual period 11/16/2015.Body mass index is 21.65 kg/m.  General Appearance: Fairly Groomed  Eye Contact:  Fair  Speech:  Clear and Coherent  Volume:  Normal  Mood:  Depressed  Affect:  Congruent  Thought Process:  Coherent and Intact  Orientation:  Full (Time, Place, and Person)  Thought Content:  WDL  Suicidal Thoughts:  No  Homicidal Thoughts:  No  Memory:  Immediate;   Fair  Judgement:  Fair  Insight:  Lacking  Psychomotor Activity:  Normal  Concentration:  Fair  Recall:  [employer]  Fund of Knowledge:Fair  Language: Fair  Akathisia:  No  Handed:  Right  AIMS  (if indicated):    Assets:  Communication Skills Desire for Improvement Housing Social Support  ADL's:  Intact  Cognition: WNL  Sleep:  8   Is the patient at risk to self?  No. Has the patient been a risk to self in the past 6 months?  No. Has the patient been a risk to self within the distant past?  No. Is the patient a risk to others?  No. Has the patient been a risk to others in the past 6 months?  No. Has the patient been a risk to others within the distant past?  No.  Allergies:   Allergies  Allergen Reactions  . Adhesive [Tape] Rash and Hives    Reaction to adhesive bandage after surgery Reaction to adhesive bandage after surgery  . Gabapentin Other (See Comments)    Increased anxiety.  Did things she doesn't remember doing. Other reaction(s): Other Increased anxiety.Did things she doesn't remember doing.  . Oxycodone-Acetaminophen Nausea And Vomiting  . Tylox [Oxycodone-Acetaminophen] Nausea And Vomiting  . Vicodin [Hydrocodone-Acetaminophen] Nausea And Vomiting  . Other Hives    Reaction to adhesive bandage after surgery   Current Medications: Current Outpatient Prescriptions  Medication Sig Dispense Refill  . lamoTRIgine (LAMICTAL) 25 MG tablet Take 1 tablet (25 mg total) by mouth daily. 30 tablet 0  . ondansetron (ZOFRAN) 8 MG tablet   0  . traZODone (DESYREL) 50 MG tablet Take 1 tablet (50 mg total) by mouth at bedtime. 30 tablet 1  . FLUoxetine (PROZAC) 20 MG tablet Take 1 tablet (20 mg total) by mouth daily. (Patient not taking: Reported on 12/24/2015) 30 tablet 0   No current facility-administered medications for this visit.     Previous Psychotropic Medications: Gabapentin, Zoloft. She is doing well on Prozac. Lyrica, savella, lunesta    Substance Abuse History in the last 12 months:  No.  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  Medication management   Discussed with patient about the medications treatment risks benefits and alternatives  Depression Continue Prozac to 20 mg po qam . She will be given 90 day supply of the medication Continue  lamotrigine 25 mg by mouth daily .patient will be given 90 day supply of the medication  She will continue  on trazodone 25 mg daily at bedtime when necessary for insomnia. Discussed with her about the side effects and she demonstrated understanding.   Follow-up in 3 month    More than 50% of the time spent in psychoeducation, counseling and coordination  of care.    This note was generated in part or whole with voice recognition software. Voice regonition is usually quite accurate but there are transcription errors that can and very often do occur. I apologize for any typographical errors that were not detected and corrected.   Brandy Hale, MD    8/14/20172:34 PM

## 2016-01-08 NOTE — Progress Notes (Signed)
   THERAPIST PROGRESS NOTE  Session Time: 60min  Participation Level: Active  Behavioral Response: Casual and Well GroomedAlertEuthymic  Type of Therapy: Individual Therapy  Treatment Goals addressed: Anxiety and Coping  Interventions: CBT, Motivational Interviewing, Solution Focused, Strength-based, Supportive and Reframing  Summary: Amber Hancock is a 25 y.o. female who presents with continued symptoms of her diagnosis.   Patient was able to verbalize 3 healthy outlets. Patient agreed to work harder on applying skills learned through counseling while working on her goals daily. Patient agreed to work on her treatment goals more frequently and for longer durations. Patient continues to deny substance usage.  Suicidal/Homicidal: Nowithout intent/plan  Therapist Response: Therapist asked Patient open-ended questions to determine how her behavior has been and progress on her goals. Therapist provided supportive counseling while Patient processed feedback. Therapist provided active listening while Patient shared information about symptoms.  Therapist requested Patient verbalize at least 5 healthy outlets when having cravings or anxiety. Therapist emphasized to Patient the importance of continuing to apply skills learned through therapy while working on her goals. Therapist summarized all that was discussed and allowed Patient to ask questions and provide additional feedback.  Plan: Return again in 4weeks.  Diagnosis: Axis I: Depression    Axis II: No diagnosis    Marinda Elkicole M Peacock, LCSW 12/24/2015

## 2016-01-24 ENCOUNTER — Ambulatory Visit (INDEPENDENT_AMBULATORY_CARE_PROVIDER_SITE_OTHER): Payer: BLUE CROSS/BLUE SHIELD | Admitting: Licensed Clinical Social Worker

## 2016-01-24 ENCOUNTER — Ambulatory Visit: Payer: BLUE CROSS/BLUE SHIELD | Admitting: Licensed Clinical Social Worker

## 2016-01-24 DIAGNOSIS — F450 Somatization disorder: Secondary | ICD-10-CM | POA: Diagnosis not present

## 2016-01-24 DIAGNOSIS — F329 Major depressive disorder, single episode, unspecified: Secondary | ICD-10-CM

## 2016-01-24 DIAGNOSIS — F32A Depression, unspecified: Secondary | ICD-10-CM

## 2016-01-29 ENCOUNTER — Ambulatory Visit (INDEPENDENT_AMBULATORY_CARE_PROVIDER_SITE_OTHER): Payer: BLUE CROSS/BLUE SHIELD | Admitting: Psychiatry

## 2016-01-29 ENCOUNTER — Encounter: Payer: Self-pay | Admitting: Psychiatry

## 2016-01-29 VITALS — BP 128/85 | HR 116 | Ht 63.0 in | Wt 122.6 lb

## 2016-01-29 DIAGNOSIS — F329 Major depressive disorder, single episode, unspecified: Secondary | ICD-10-CM

## 2016-01-29 DIAGNOSIS — F32A Depression, unspecified: Secondary | ICD-10-CM

## 2016-01-29 DIAGNOSIS — F4323 Adjustment disorder with mixed anxiety and depressed mood: Secondary | ICD-10-CM

## 2016-01-29 DIAGNOSIS — F450 Somatization disorder: Secondary | ICD-10-CM | POA: Diagnosis not present

## 2016-01-29 MED ORDER — TRAZODONE HCL 50 MG PO TABS: 50.0000 mg | ORAL_TABLET | Freq: Every day | ORAL | 1 refills | Status: DC

## 2016-01-29 MED ORDER — LAMOTRIGINE 25 MG PO TABS: 25.0000 mg | ORAL_TABLET | Freq: Every day | ORAL | 1 refills | Status: DC

## 2016-01-29 MED ORDER — FLUOXETINE HCL 20 MG PO TABS: 20.0000 mg | ORAL_TABLET | Freq: Every day | ORAL | 1 refills | Status: DC

## 2016-01-29 MED ORDER — ALPRAZOLAM 0.5 MG PO TABS: 0.5000 mg | ORAL_TABLET | Freq: Every evening | ORAL | 0 refills | Status: DC | PRN

## 2016-01-29 NOTE — Progress Notes (Signed)
Psychiatric Follow up MD/NP Note   Patient Identification: Amber Hancock Howser MRN:  [id number] Date of Evaluation:  01/29/2016 Referral Source: Referral from PCP- [facility]  Chief Complaint:    Visit Diagnosis:    ICD-9-CM ICD-10-CM   1. 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 Adjustment disorder with mixed anxiety and depressed mood 309.28 F43.23    Diagnosis:   Patient Active Problem List   Diagnosis Date Noted  . Fibromyalgia [M79.7] 05/03/2015  . Chronic pain associated with significant psychosocial dysfunction [G89.4] 05/02/2015  . Anxiety [F41.9] 02/27/2015  . Blush [R23.2] 09/21/2013  . Spasm [R25.2] 09/21/2013  . Infrequent menses [N91.5] 09/21/2013  . Pelvic pain [R10.2] 06/15/2013  . Pelvic and perineal pain [R10.2] 06/15/2013  . Chronic female pelvic pain [N94.9, G89.29] 08/14/2012  . Syncope [R55] 02/11/2012   History of Present Illness:    Pt  is a 25 year old single  female who presented for follow-up appointment. SheReported that she is relocating to [place] tomorrow morning. She has a relationship and her current boyfriend lives in [place] he is in the [place] [employer]. She reported that she is excited about moving to [place]. She reported that she is scared of flying as she has never do in her life. She discussed about starting her on a very small dose of Xanax and will be given only 5 pills. She reported that she'll come back to [place] for 2 weeks to take her belongings. She reported that the change will be new for her and she is excited about the same. She is compliant with her medications. She denied having any suicidal homicidal ideations or plans.       Past Medical History:  Past Medical History:  Diagnosis Date  . Anxiety   . Chronic pelvic pain in female   . Depression   . Fibromyalgia   . Streak gonad    Left    Past Surgical History:  Procedure Laterality Date  . ABDOMINAL HYSTERECTOMY    . LAPAROTOMY    .  SALPINGOOPHORECTOMY     Family History:  Family History  Problem Relation Age of Onset  . Hypertension Other     Malignant  . Diverticulitis Mother   . Hypertension Mother   . Cancer Paternal Grandmother   . Post-traumatic stress disorder Father   . Mental illness Brother   . Depression Sister    Social History:   Social History   Social History  . Marital status: Single    Spouse name: N/A  . Number of children: N/A  . Years of education: N/A   Social History Main Topics  . Smoking status: Never Smoker  . Smokeless tobacco: Never Used  . Alcohol use 0.0 - 1.2 oz/week     Comment: occasional  . Drug use: No  . Sexual activity: Yes    Partners: Male    Birth control/ protection: None   Other Topics Concern  . Not on file   Social History Narrative  . No narrative on file   Additional Social History:  Looking for a job.  Musculoskeletal: Strength & Muscle Tone: within normal limits Gait & Station: normal Patient leans: N/A  Psychiatric Specialty Exam: Medication Refill   Anxiety       ROS  There were no vitals taken for this visit.There is no height or weight on file to calculate BMI.  General Appearance: Fairly Groomed  Eye Contact:  Fair  Speech:  Clear and Coherent  Volume:  Normal  Mood:  Depressed  Affect:  Congruent  Thought Process:  Coherent and Intact  Orientation:  Full (Time, Place, and Person)  Thought Content:  WDL  Suicidal Thoughts:  No  Homicidal Thoughts:  No  Memory:  Immediate;   Fair  Judgement:  Fair  Insight:  Lacking  Psychomotor Activity:  Normal  Concentration:  Fair  Recall:  [employer] of Knowledge:Fair  Language: Fair  Akathisia:  No  Handed:  Right  AIMS (if indicated):    Assets:  Communication Skills Desire for Improvement Housing Social Support  ADL's:  Intact  Cognition: WNL  Sleep:  8   Is the patient at risk to self?  No. Has the patient been a risk to self in the past 6 months?  No. Has the  patient been a risk to self within the distant past?  No. Is the patient a risk to others?  No. Has the patient been a risk to others in the past 6 months?  No. Has the patient been a risk to others within the distant past?  No.  Allergies:   Allergies  Allergen Reactions  . Adhesive [Tape] Rash and Hives    Reaction to adhesive bandage after surgery Reaction to adhesive bandage after surgery  . Gabapentin Other (See Comments)    Increased anxiety.  Did things she doesn't remember doing. Other reaction(s): Other Increased anxiety.Did things she doesn't remember doing.  . Oxycodone-Acetaminophen Nausea And Vomiting  . Tylox [Oxycodone-Acetaminophen] Nausea And Vomiting  . Vicodin [Hydrocodone-Acetaminophen] Nausea And Vomiting  . Other Hives    Reaction to adhesive bandage after surgery   Current Medications: Current Outpatient Prescriptions  Medication Sig Dispense Refill  . FLUoxetine (PROZAC) 20 MG tablet Take 1 tablet (20 mg total) by mouth daily. 90 tablet 1  . lamoTRIgine (LAMICTAL) 25 MG tablet Take 1 tablet (25 mg total) by mouth daily. 90 tablet 1  . ondansetron (ZOFRAN) 8 MG tablet   0  . traZODone (DESYREL) 50 MG tablet Take 1 tablet (50 mg total) by mouth at bedtime. 90 tablet 1   No current facility-administered medications for this visit.     Previous Psychotropic Medications: Gabapentin, Zoloft. She is doing well on Prozac. Lyrica, savella, lunesta    Substance Abuse History in the last 12 months:  No.  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  Medication management   Discussed with patient about the medications treatment risks benefits and alternatives  Depression Continue Prozac to 20  mg po qam . She will be given 90 day supply of the medication Continue  lamotrigine 25 mg by mouth daily .patient will be given 90 day supply of the medication  She will continue  on trazodone 25 mg daily at bedtime when necessary for insomnia. Discussed with her about the side effects and she demonstrated understanding.  I will also prescribe her Xanax 0.5 mg when necessary and will be given 5 pills No follow-up is necessary as she is relocating to [place] at this time   More than 50% of the time spent in psychoeducation, counseling and coordination of care.    This note was generated in part or whole with voice recognition software. Voice regonition is usually quite  accurate but there are transcription errors that can and very often do occur. I apologize for any typographical errors that were not detected and corrected.   Brandy Hale, MD    9/19/20172:13 PM

## 2016-01-31 NOTE — Progress Notes (Signed)
   THERAPIST PROGRESS NOTE  Session Time: 39mn  Participation Level: Active  Behavioral Response: Neat and Well GroomedAlertEuthymic  Type of Therapy: Individual Therapy  Treatment Goals addressed: Coping  Interventions: CBT, Motivational Interviewing and Solution Focused  Summary: AAIRYANNA DIPALMAis a 25y.o. female who presents with continued symptoms of her diagnosis.  Patient was able to vent her frustrations.  Patient reports that she is moving to [place] live with a female that she has met online.  SHe reports that she has weighed out her options and is able to return to [place] if things do not work on.  Discussion of her being able to remain on her medication and her being able to continue with therapy and pain management.  Patient verbally listed coping skills that she uses to assist with self calming.  Patient able to reports that she has made progress with her goals.  Therapist explained cognitive development to Patient then challenged her thinking using a quick exercise. Therapist continued with cognitive distortions and irrational thoughts. Therapist and Patient reviewed emotions and ways that our thoughts overshadow and influence our emotions.  Therapist prompted Patient to provide an example of a time when her thought process influenced the way she felt. Therapist provided praise as Patient was able to recall a fitting incident for the discussion. Therapist inquired about Patient's current medication regimen.    Suicidal/Homicidal: No  Therapist Response: Provided support for Patient as she discussed her current mood. Stressed the importance of mood stabilization through learned coping skills and medication management.  Encouraged Patient to focus on her strengths and the positive aspects.  Plan: Return again in 2 weeks.  Diagnosis: Axis I: Depression with Somatization    Axis II: No diagnosis    NLubertha South LCSW 01/24/2016

## 2016-03-25 ENCOUNTER — Ambulatory Visit: Payer: BLUE CROSS/BLUE SHIELD | Admitting: Psychiatry

## 2016-05-13 ENCOUNTER — Other Ambulatory Visit: Payer: Self-pay

## 2016-05-13 NOTE — Telephone Encounter (Signed)
pt called states that she looking for a home in [place] but as of yet has not found anything. pt states that she will be out of medication this friday she is still in [place] .  Your next available appt is not until feb. please advise

## 2016-05-15 ENCOUNTER — Other Ambulatory Visit: Payer: Self-pay | Admitting: Psychiatry

## 2016-05-15 MED ORDER — LAMOTRIGINE 25 MG PO TABS: 25.0000 mg | ORAL_TABLET | Freq: Every day | ORAL | 0 refills | Status: DC

## 2016-05-15 MED ORDER — FLUOXETINE HCL 20 MG PO TABS: 20.0000 mg | ORAL_TABLET | Freq: Every day | ORAL | 0 refills | Status: DC

## 2016-05-15 MED ORDER — FLUOXETINE HCL 20 MG PO TABS: 20.0000 mg | ORAL_TABLET | Freq: Every day | ORAL | 1 refills | Status: DC

## 2016-05-15 MED ORDER — TRAZODONE HCL 50 MG PO TABS: 50.0000 mg | ORAL_TABLET | Freq: Every day | ORAL | 1 refills | Status: DC

## 2016-05-15 NOTE — Telephone Encounter (Signed)
meds refilled. Please call pt to make follow up appt.

## 2016-09-02 ENCOUNTER — Ambulatory Visit: Payer: Self-pay | Admitting: Allergy and Immunology

## 2016-09-08 ENCOUNTER — Encounter: Payer: Self-pay | Admitting: Psychiatry

## 2016-09-08 ENCOUNTER — Ambulatory Visit (INDEPENDENT_AMBULATORY_CARE_PROVIDER_SITE_OTHER): Payer: BLUE CROSS/BLUE SHIELD | Admitting: Psychiatry

## 2016-09-08 VITALS — BP 124/81 | HR 82 | Temp 97.5°F | Wt 122.2 lb

## 2016-09-08 DIAGNOSIS — F450 Somatization disorder: Secondary | ICD-10-CM | POA: Diagnosis not present

## 2016-09-08 DIAGNOSIS — F329 Major depressive disorder, single episode, unspecified: Secondary | ICD-10-CM

## 2016-09-08 MED ORDER — LAMOTRIGINE 25 MG PO TABS: 50.0000 mg | ORAL_TABLET | Freq: Every day | ORAL | 1 refills | Status: DC

## 2016-09-08 MED ORDER — FLUOXETINE HCL 20 MG PO TABS: 20.0000 mg | ORAL_TABLET | Freq: Every day | ORAL | 1 refills | Status: DC

## 2016-09-08 NOTE — Progress Notes (Signed)
Psychiatric Follow up MD/NP Note   Patient Identification: Amber Hancock:  [id number] Date of Evaluation:  09/08/2016 Referral Source: Referral from PCP- [facility]  Chief Complaint:    Visit Diagnosis:    ICD-9-CM ICD-10-CM   1. 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 Fibromyalgia [M79.7] 05/03/2015  . Chronic pain associated with significant psychosocial dysfunction [G89.4] 05/02/2015  . Anxiety [F41.9] 02/27/2015  . Blush [R23.2] 09/21/2013  . Spasm [R25.2] 09/21/2013  . Infrequent menses [N91.5] 09/21/2013  . Pelvic pain [R10.2] 06/15/2013  . Pelvic and perineal pain [R10.2] 06/15/2013  . Chronic female pelvic pain [R10.2, G89.29] 08/14/2012  . Syncope [R55] 02/11/2012   History of Present Illness:    Pt  is a 26 year old single  female who presented for follow-up appointment. She reported that Briefly relocated to [place] for 4 months. 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 Patient reported that she has been taking her medications as prescribed. She went to a clinic in [place] and they did not change her medications. She wants to go higher on the dose of lamotrigine as it helps with her more swings. Patient currently denied having any suicidal ideations or plans. She reported that she might relocated to [place] or [place]. She has not decided yet. She lives with roommates at this time. She appeared calm and alert during the interview. She is taking her medications as prescribed and is not having any side effects.       Past Medical History:  Past Medical History:  Diagnosis Date  . Anxiety   . Chronic pelvic pain in female   . Depression   . Fibromyalgia   . Streak gonad    Left    Past Surgical History:  Procedure Laterality Date  . ABDOMINAL HYSTERECTOMY    . LAPAROTOMY    . SALPINGOOPHORECTOMY     Family History:  Family History   Problem Relation Age of Onset  . Hypertension Other     Malignant  . Diverticulitis Mother   . Hypertension Mother   . Cancer Paternal Grandmother   . Post-traumatic stress disorder Father   . Mental illness Brother   . Depression Sister    Social History:   Social History   Social History  . Marital status: Single    Spouse name: N/A  . Number of children: N/A  . Years of education: N/A   Social History Main Topics  . Smoking status: Never Smoker  . Smokeless tobacco: Never Used  . Alcohol use 0.0 - 1.2 oz/week     Comment: occasional  . Drug use: No  . Sexual activity: Yes    Partners: Male    Birth control/ protection: None   Other Topics Concern  . Not on file   Social History Narrative  . No narrative on file   Additional Social History:  Looking for a job.  Musculoskeletal: Strength & Muscle Tone: within normal limits Gait & Station: normal Patient leans: N/A  Psychiatric Specialty Exam: Medication Refill   Anxiety       ROS  There were no vitals taken for this visit.There is no height or weight on file to calculate BMI.  General Appearance: Fairly Groomed  Eye Contact:  Fair  Speech:  Clear and Coherent  Volume:  Normal  Mood:  Depressed  Affect:  Congruent  Thought Process:  Coherent and Intact  Orientation:  Full (Time, Place, and Person)  Thought Content:  WDL  Suicidal Thoughts:  No  Homicidal Thoughts:  No  Memory:  Immediate;   Fair  Judgement:  Fair  Insight:  Lacking  Psychomotor Activity:  Normal  Concentration:  Fair  Recall:  [employer] of Knowledge:Fair  Language: Fair  Akathisia:  No  Handed:  Right  AIMS (if indicated):    Assets:  Communication Skills Desire for Improvement Housing Social Support  ADL's:  Intact  Cognition: WNL  Sleep:  8   Is the patient at risk to self?  No. Has the patient been a risk to self in the past 6 months?  No. Has the patient been a risk to self within the distant past?  No. Is  the patient a risk to others?  No. Has the patient been a risk to others in the past 6 months?  No. Has the patient been a risk to others within the distant past?  No.  Allergies:   Allergies  Allergen Reactions  . Adhesive [Tape] Rash and Hives    Reaction to adhesive bandage after surgery Reaction to adhesive bandage after surgery  . Gabapentin Other (See Comments)    Increased anxiety.  Did things she doesn't remember doing. Other reaction(s): Other Increased anxiety.Did things she doesn't remember doing.  . Oxycodone-Acetaminophen Nausea And Vomiting  . Tylox [Oxycodone-Acetaminophen] Nausea And Vomiting  . Vicodin [Hydrocodone-Acetaminophen] Nausea And Vomiting  . Other Hives    Reaction to adhesive bandage after surgery   Current Medications: Current Outpatient Prescriptions  Medication Sig Dispense Refill  . FLUoxetine (PROZAC) 20 MG tablet Take 1 tablet (20 mg total) by mouth daily. 30 tablet 1  . lamoTRIgine (LAMICTAL) 25 MG tablet Take 1 tablet (25 mg total) by mouth daily. 90 tablet 0  . ondansetron (ZOFRAN) 8 MG tablet   0  . traZODone (DESYREL) 50 MG tablet Take 1 tablet (50 mg total) by mouth at bedtime. 30 tablet 1   No current facility-administered medications for this visit.     Previous Psychotropic Medications: Gabapentin, Zoloft. She is doing well on Prozac. Lyrica, savella, lunesta    Substance Abuse History in the last 12 months:  No.  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  Medication management   Discussed with patient about the medications treatment risks benefits and alternatives  Depression Continue Prozac to 20 mg po qam .  Continue  lamotrigine 50  mg by mouth daily  .  Follow-up in 2 months or earlier depending on her symptoms.    More than 50% of the time spent in psychoeducation, counseling and coordination of care.    This note was generated in part or whole with voice recognition software. Voice regonition is usually quite accurate but there are transcription errors that can and very often do occur. I apologize for any typographical errors that were not detected and corrected.   Brandy Hale, MD    4/30/20184:02 PM

## 2016-09-23 ENCOUNTER — Ambulatory Visit: Payer: BLUE CROSS/BLUE SHIELD | Admitting: Licensed Clinical Social Worker

## 2016-09-30 ENCOUNTER — Ambulatory Visit: Payer: BLUE CROSS/BLUE SHIELD | Admitting: Licensed Clinical Social Worker

## 2016-10-08 ENCOUNTER — Ambulatory Visit (INDEPENDENT_AMBULATORY_CARE_PROVIDER_SITE_OTHER): Payer: BLUE CROSS/BLUE SHIELD | Admitting: Licensed Clinical Social Worker

## 2016-10-08 DIAGNOSIS — F329 Major depressive disorder, single episode, unspecified: Secondary | ICD-10-CM

## 2016-10-08 DIAGNOSIS — F450 Somatization disorder: Secondary | ICD-10-CM | POA: Diagnosis not present

## 2016-10-08 DIAGNOSIS — F32A Depression, unspecified: Secondary | ICD-10-CM

## 2016-10-14 ENCOUNTER — Encounter (INDEPENDENT_AMBULATORY_CARE_PROVIDER_SITE_OTHER): Payer: Self-pay

## 2016-10-14 ENCOUNTER — Ambulatory Visit (INDEPENDENT_AMBULATORY_CARE_PROVIDER_SITE_OTHER): Payer: BLUE CROSS/BLUE SHIELD | Admitting: Allergy and Immunology

## 2016-10-14 ENCOUNTER — Encounter: Payer: Self-pay | Admitting: Allergy and Immunology

## 2016-10-14 VITALS — BP 112/68 | HR 92 | Temp 98.7°F | Resp 20 | Ht 64.0 in | Wt 121.8 lb

## 2016-10-14 DIAGNOSIS — H1045 Other chronic allergic conjunctivitis: Secondary | ICD-10-CM

## 2016-10-14 DIAGNOSIS — J3089 Other allergic rhinitis: Secondary | ICD-10-CM | POA: Diagnosis not present

## 2016-10-14 DIAGNOSIS — L500 Allergic urticaria: Secondary | ICD-10-CM

## 2016-10-14 DIAGNOSIS — H1010 Acute atopic conjunctivitis, unspecified eye: Secondary | ICD-10-CM

## 2016-10-14 MED ORDER — EPINEPHRINE 0.3 MG/0.3ML IJ SOAJ: INTRAMUSCULAR | 1 refills | Status: DC

## 2016-10-14 NOTE — Patient Instructions (Addendum)
  1. Blood - CBC w/diff, CMP, TSH, T4, TP, SED, Onion IgE  2. Return for skin testing without use of Zyrtec/antihistamine for 3 days  3. Use OTC Nasacort 1 spray each nostril 3-7 times per week. Takes days to work.  4. If needed:   A. Zyrtec or similar (Claritin / Allergra) one time per day  B. Auvi-Q, Benadryl, M.D./ER evaluation for allergic reaction  5. Further evaluation and treatment?

## 2016-10-14 NOTE — Progress Notes (Signed)
Dear Georgette Shell,  Thank you for referring Amber Hancock to the [facility] on 10/14/2016.   Below is a summation of this patient's evaluation and recommendations.  Thank you for your referral. I will keep you informed about this patient's response to treatment.   If you have any questions please do not hesitate to contact me.   Sincerely,  Jessica Priest, MD Allergy / Immunology [facility]   ______________________________________________________________________    NEW PATIENT NOTE  Referring Provider: Lucila Maine Primary Provider: Lucila Maine Date of office visit: 10/14/2016    Subjective:   Chief Complaint:  Amber Hancock (DOB: 1991-01-27) is a 26 y.o. female who presents to the clinic on 10/14/2016 with a chief complaint of Urticaria (2 months ago happened 1 time only) .     HPI: Amber Hancock presents this clinic in evaluation of several different issues.  First, she apparently had some type of global urticaria reaction about 2 months ago that lingered for about one week without any associated systemic or constitutional symptoms and without any obvious provoking factor. Her lesions never healed with scar or hyperpigmentation. She took Benadryl throughout that week. Ever since that event she's been using Zyrtec 10 mg daily and has not had return of hives.  Second, she has a history of sneezing and nose blowing and itchy red watery eyes that appears to be precipitated by exposure to pollen and dust but also appears to occur to some degree on a perennial basis. She has use nasal steroids in the past. Zyrtec certainly does help this issue.  Third, when eating white onions she has developed itchy skin and vomiting and diarrhea within minutes. This has apparently occurred since around the age of 5 or so. Apparently she can eat dehydrated onions with no problem. Orange  juice also upsets her stomach but does not gives to other associated systemic or constitutional symptoms.  Fourth, she has a history of contact rash with adhesive. As well, she gets skin burning when having exposure to rubbing alcohol.  Past Medical History:  Diagnosis Date  . Anxiety   . Chronic pelvic pain in female   . Depression   . Fibromyalgia   . Streak gonad    Left    Past Surgical History:  Procedure Laterality Date  . ABDOMINAL HYSTERECTOMY    . LAPAROTOMY    . SALPINGOOPHORECTOMY      Allergies as of 10/14/2016      Reactions   Adhesive [tape] Rash, Hives   Reaction to adhesive bandage after surgery Reaction to adhesive bandage after surgery   Gabapentin Other (See Comments)   Increased anxiety.  Did things she doesn't remember doing. Other reaction(s): Other Increased anxiety.Did things she doesn't remember doing.   Onion Hives   Oxycodone-acetaminophen Nausea And Vomiting   Tylox [oxycodone-acetaminophen] Nausea And Vomiting   Vicodin [hydrocodone-acetaminophen] Nausea And Vomiting   Other Hives   Reaction to adhesive bandage after surgery      Medication List      cetirizine 10 MG tablet Commonly known as:  ZYRTEC Take 10 mg by mouth daily.   FLUoxetine 20 MG capsule Commonly known as:  PROZAC   lamoTRIgine 25 MG tablet Commonly known as:  LAMICTAL Take 2 tablets (50 mg total) by mouth daily.   traZODone 50 MG tablet Commonly known as:  DESYREL Take by mouth.  Review of systems negative except as noted in HPI / PMHx or noted below:  Review of Systems  Constitutional: Negative.   HENT: Negative.   Eyes: Negative.   Respiratory: Negative.   Cardiovascular: Negative.   Gastrointestinal: Negative.   Genitourinary: Negative.   Musculoskeletal: Negative.   Skin: Negative.   Neurological: Negative.   Endo/Heme/Allergies: Negative.   Psychiatric/Behavioral: Negative.     Family History  Problem Relation Age of Onset  .  Hypertension Other        Malignant  . Diverticulitis Mother   . Hypertension Mother   . Cancer Paternal Grandmother   . Post-traumatic stress disorder Father   . Eczema Father   . Mental illness Brother   . Depression Sister   . Allergic rhinitis Neg Hx   . Asthma Neg Hx   . Angioedema Neg Hx   . Urticaria Neg Hx     Social History   Social History  . Marital status: Single    Spouse name: N/A  . Number of children: N/A  . Years of education: N/A   Occupational History  . Not on file.   Social History Main Topics  . Smoking status: Never Smoker  . Smokeless tobacco: Never Used  . Alcohol use 0.0 - 1.2 oz/week     Comment: occasional  . Drug use: No  . Sexual activity: Yes    Partners: Male    Birth control/ protection: None   Other Topics Concern  . Not on file   Social History Narrative  . No narrative on file    Environmental and Social history  Lives in a house with a dry environment, cats located inside the household, carpeting in the bedroom, no plastic on the bed or pillow, no smoking ongoing with inside the household. She is a Environmental managerphotographer.  Objective:   Vitals:   10/14/16 1407  BP: 112/68  Pulse: 92  Resp: 20  Temp: 98.7 F (37.1 C)   Height: 5\' 4"  (162.6 cm) Weight: 121 lb 12.8 oz (55.2 kg)  Physical Exam  Constitutional: She is well-developed, well-nourished, and in no distress.  HENT:  Head: Normocephalic. Head is without right periorbital erythema and without left periorbital erythema.  Right Ear: Tympanic membrane, external ear and ear canal normal.  Left Ear: Tympanic membrane, external ear and ear canal normal.  Nose: Nose normal. No mucosal edema or rhinorrhea.  Mouth/Throat: Uvula is midline, oropharynx is clear and moist and mucous membranes are normal. No oropharyngeal exudate.  Eyes: Conjunctivae and lids are normal. Pupils are equal, round, and reactive to light.  Neck: Trachea normal. No tracheal tenderness present. No  tracheal deviation present. No thyromegaly present.  Cardiovascular: Normal rate, regular rhythm, S1 normal, S2 normal and normal heart sounds.   No murmur heard. Pulmonary/Chest: Effort normal and breath sounds normal. No stridor. No tachypnea. No respiratory distress. She has no wheezes. She has no rales. She exhibits no tenderness.  Abdominal: Soft. She exhibits no distension and no mass. There is no hepatosplenomegaly. There is no tenderness. There is no rebound and no guarding.  Musculoskeletal: She exhibits no edema or tenderness.  Lymphadenopathy:       Head (right side): No tonsillar adenopathy present.       Head (left side): No tonsillar adenopathy present.    She has no cervical adenopathy.    She has no axillary adenopathy.  Neurological: She is alert. Gait normal.  Skin: No rash noted. She is not diaphoretic. No erythema.  No pallor. Nails show no clubbing.  Psychiatric: Mood and affect normal.    Diagnostics: Allergy skin tests were not performed secondary to the recent administration of an antihistamine.   Assessment and Plan:    1. Other allergic rhinitis   2. Allergic urticaria   3. Seasonal allergic conjunctivitis     1. Blood - CBC w/diff, CMP, TSH, T4, TP, SED, Onion IgE  2. Return for skin testing without use of Zyrtec/antihistamine for 3 days  3. Use OTC Nasacort 1 spray each nostril 3-7 times per week. Takes days to work.  4. If needed:   A. Zyrtec or similar (Claritin / Allergra) one time per day  B. Auvi-Q, Benadryl, M.D./ER evaluation for allergic reaction  5. Further evaluation and treatment?  It is not entirely clear why Kendrick had a episode of immunological hyperreactivity 2 months ago with global urticaria but we will assess her major organ function with blood tests mentioned above and as well we will assess whether or not she has IgE antibodies directed against onion given her history of developing significant reactions when being exposed to this  food product. I have asked her to take some form of nasal steroid on a pretty regular basis to help with the inflammation giving rise to her upper airway symptoms. We will assess her aero allergen and food hypersensitivity profile when she can return to this clinic to have skin tests performed without the use of an antihistamine.  Jessica Priest, MD [facility]

## 2016-10-15 NOTE — Progress Notes (Signed)
   THERAPIST PROGRESS NOTE  Session Time: 60min  Participation Level: Active  Behavioral Response: Neat and Well GroomedAlertEuthymic  Type of Therapy: Individual Therapy  Treatment Goals addressed: Coping  Interventions: CBT, Motivational Interviewing and Solution Focused  Summary: Amber Hancock is a 26 y.o. female who presents with continued symptoms of her diagnosis. Patient arrived at the session with a smile on her face.  Patient reports that she moved back to [place] after being in [place] for a few months.  She reports that she is no longer in a relatioship with him.  She reprots taht she is now in a relationship with an older man and she is happy.  She reports that she is working at [employer] a [employer] company.  She denies current symptoms and reports taht she wants to establish her MH before it becomes a concern for her.  Suicidal/Homicidal: No  Therapist Response: Provided support for Patient as she discussed her current mood.  Encouraged Patient to focus on her strengths and the positive aspects.  Plan: Return again in 2 weeks.  Diagnosis: Axis I: Depression with Somatization    Axis II: No diagnosis    Marinda Elkicole M Cammi Consalvo, LCSW 10/08/2016

## 2016-10-30 ENCOUNTER — Ambulatory Visit: Payer: BLUE CROSS/BLUE SHIELD | Admitting: Licensed Clinical Social Worker

## 2016-11-09 ENCOUNTER — Other Ambulatory Visit: Payer: Self-pay | Admitting: Psychiatry

## 2017-01-13 ENCOUNTER — Inpatient Hospital Stay (HOSPITAL_COMMUNITY)
Admission: AD | Admit: 2017-01-13 | Discharge: 2017-01-13 | Disposition: A | Discharge status: Home / Self Care | Payer: Medicaid Other | Source: Ambulatory Visit | Attending: Family Medicine | Admitting: Family Medicine

## 2017-01-13 ENCOUNTER — Inpatient Hospital Stay (HOSPITAL_COMMUNITY): Payer: Medicaid Other

## 2017-01-13 ENCOUNTER — Encounter (HOSPITAL_COMMUNITY): Payer: Self-pay | Admitting: *Deleted

## 2017-01-13 ENCOUNTER — Encounter (HOSPITAL_COMMUNITY): Payer: Self-pay | Admitting: Emergency Medicine

## 2017-01-13 ENCOUNTER — Ambulatory Visit (HOSPITAL_COMMUNITY)
Admission: EM | Admit: 2017-01-13 | Discharge: 2017-01-13 | Disposition: A | Discharge status: Home / Self Care | Payer: Self-pay | Attending: Family Medicine | Admitting: Family Medicine

## 2017-01-13 DIAGNOSIS — Z885 Allergy status to narcotic agent status: Secondary | ICD-10-CM | POA: Diagnosis not present

## 2017-01-13 DIAGNOSIS — M797 Fibromyalgia: Secondary | ICD-10-CM | POA: Insufficient documentation

## 2017-01-13 DIAGNOSIS — O99342 Other mental disorders complicating pregnancy, second trimester: Secondary | ICD-10-CM | POA: Insufficient documentation

## 2017-01-13 DIAGNOSIS — F329 Major depressive disorder, single episode, unspecified: Secondary | ICD-10-CM | POA: Diagnosis not present

## 2017-01-13 DIAGNOSIS — O26892 Other specified pregnancy related conditions, second trimester: Secondary | ICD-10-CM | POA: Diagnosis not present

## 2017-01-13 DIAGNOSIS — R109 Unspecified abdominal pain: Secondary | ICD-10-CM | POA: Insufficient documentation

## 2017-01-13 DIAGNOSIS — O219 Vomiting of pregnancy, unspecified: Secondary | ICD-10-CM | POA: Diagnosis not present

## 2017-01-13 DIAGNOSIS — Z3201 Encounter for pregnancy test, result positive: Secondary | ICD-10-CM

## 2017-01-13 DIAGNOSIS — F419 Anxiety disorder, unspecified: Secondary | ICD-10-CM | POA: Insufficient documentation

## 2017-01-13 DIAGNOSIS — Z79899 Other long term (current) drug therapy: Secondary | ICD-10-CM | POA: Diagnosis not present

## 2017-01-13 DIAGNOSIS — R112 Nausea with vomiting, unspecified: Secondary | ICD-10-CM

## 2017-01-13 DIAGNOSIS — Z3490 Encounter for supervision of normal pregnancy, unspecified, unspecified trimester: Secondary | ICD-10-CM

## 2017-01-13 DIAGNOSIS — Z3A01 Less than 8 weeks gestation of pregnancy: Secondary | ICD-10-CM | POA: Diagnosis not present

## 2017-01-13 DIAGNOSIS — Z331 Pregnant state, incidental: Secondary | ICD-10-CM

## 2017-01-13 LAB — POCT PREGNANCY, URINE: Preg Test, Ur: POSITIVE — AB

## 2017-01-13 MED ORDER — ONDANSETRON 4 MG PO TBDP
ORAL_TABLET | ORAL | Status: AC
  Filled 2017-01-13: qty 2

## 2017-01-13 MED ORDER — FOLIC ACID 1 MG PO TABS: 1.0000 mg | ORAL_TABLET | Freq: Every day | ORAL | 10 refills | Status: DC

## 2017-01-13 MED ORDER — DOXYLAMINE-PYRIDOXINE 10-10 MG PO TBEC: 2.0000 | DELAYED_RELEASE_TABLET | Freq: Every day | ORAL | 0 refills | Status: DC

## 2017-01-13 MED ORDER — ONDANSETRON 4 MG PO TBDP
8.0000 mg | ORAL_TABLET | Freq: Once | ORAL | Status: AC
  Administered 2017-01-13: 8 mg via ORAL

## 2017-01-13 MED ORDER — PROMETHAZINE HCL 25 MG PO TABS: 25.0000 mg | ORAL_TABLET | Freq: Four times a day (QID) | ORAL | 2 refills | Status: DC | PRN

## 2017-01-13 MED ORDER — PRENATAL COMPLETE 14-0.4 MG PO TABS: 1.0000 | ORAL_TABLET | Freq: Every day | ORAL | 0 refills | Status: AC

## 2017-01-13 NOTE — MAU Note (Signed)
Been throwing up since last Tues, all day long.  Thought it was a stomach bug.  Went to the dr, found out preg.  Has had left tube and ovary removed.  Uterus is retroverted, hx of "muscle spasms".  Would told she probably would not get preg and that she had a high risk of miscarriage. Was given zofran at clinic, no longer nauseated..Marland Kitchen

## 2017-01-13 NOTE — MAU Provider Note (Signed)
History     CSN: [id number]  Arrival date and time: 01/13/17 1243   None     Chief Complaint  Patient presents with  . Abdominal Pain  . Nausea   HPI 26 yo G1P0 at 932w2d by LMP sent from [facility] for evaluation of pregnancy. Patient was seen earlier today due to persistent nausea and emesis. She did not suspect a pregnancy as she was told she could not conceive. She had a previous left salpingooophorectomy. Patient denies any abdominal pain. She denies any vaginal bleeding. Patient reports improvement in her nausea following zofran  OB History    Gravida Para Term Preterm AB Living   1             SAB TAB Ectopic Multiple Live Births                  Past Medical History:  Diagnosis Date  . Anxiety   . Chronic pelvic pain in female   . Depression   . Fibromyalgia   . Streak gonad    Left    Past Surgical History:  Procedure Laterality Date  . ABDOMINAL HYSTERECTOMY    . LAPAROTOMY    . SALPINGOOPHORECTOMY      Family History  Problem Relation Age of Onset  . Hypertension Other        Malignant  . Diverticulitis Mother   . Hypertension Mother   . Cancer Paternal Grandmother   . Post-traumatic stress disorder Father   . Eczema Father   . Mental illness Brother   . Depression Sister   . Allergic rhinitis Neg Hx   . Asthma Neg Hx   . Angioedema Neg Hx   . Urticaria Neg Hx     Social History  Substance Use Topics  . Smoking status: Never Smoker  . Smokeless tobacco: Never Used  . Alcohol use 0.0 - 1.2 oz/week     Comment: occasional    Allergies:  Allergies  Allergen Reactions  . Adhesive [Tape] Rash and Hives    Reaction to adhesive bandage after surgery Reaction to adhesive bandage after surgery  . Gabapentin Other (See Comments)    Increased anxiety.  Did things she doesn't remember doing. Other reaction(s): Other Increased anxiety.Did things she doesn't remember doing.  . Onion Hives  . Oxycodone-Acetaminophen Nausea And  Vomiting  . Tylox [Oxycodone-Acetaminophen] Nausea And Vomiting  . Vicodin [Hydrocodone-Acetaminophen] Nausea And Vomiting  . Other Hives    Reaction to adhesive bandage after surgery    Prescriptions Prior to Admission  Medication Sig Dispense Refill Last Dose  . cetirizine (ZYRTEC) 10 MG tablet Take 10 mg by mouth daily.  0 Taking  . Doxylamine-Pyridoxine (DICLEGIS) 10-10 MG TBEC Take 2 tablets by mouth at bedtime. After two days, may increase to 2 tablets twice a day 60 tablet 0   . EPINEPHrine (AUVI-Q) 0.3 mg/0.3 mL IJ SOAJ injection Use as directed for severe allergic reaction 2 Device 1   . FLUoxetine (PROZAC) 20 MG capsule   0 Taking  . lamoTRIgine (LAMICTAL) 25 MG tablet Take 2 tablets (50 mg total) by mouth daily. 60 tablet 1 Taking  . Prenatal Vit-Fe Fumarate-FA (PRENATAL COMPLETE) 14-0.4 MG TABS Take 1 tablet by mouth daily. 60 each 0   . traZODone (DESYREL) 50 MG tablet Take by mouth.   Taking    Review of Systems  See pertinent in HPI Physical Exam   Blood pressure 137/83, pulse 85, temperature 98.4 F (36.9  C), temperature source Oral, resp. rate 18, height 5' 5.5" (1.664 m), weight 127 lb 4 oz (57.7 kg), last menstrual period 11/23/2016, SpO2 100 %.  Physical Exam GENERAL: Well-developed, well-nourished female in no acute distress.  ABDOMEN: Soft, nontender, nondistended. No organomegaly. PELVIC: Normal external female genitalia. Vagina is pink and rugated.  Normal discharge. Normal appearing cervix. Uterus is normal in size. No adnexal mass or tenderness. EXTREMITIES: No cyanosis, clubbing, or edema, 2+ distal pulses.  MAU Course  Procedures  MDM US Ob Comp Less 14 Wks  Result Date: 01/13/2017 CLINICAL DATA:  Pregnant, cramping, for viability/dating EXAM: OBSTETRIC <14 WK [place] AND TRANSVAGINAL OB US TECHNIQUE: Both transabdominal and transvaginal ultrasound examinations were performed for complete evaluation of the gestation as well as the maternal uterus, adnexal  regions, and pelvic cul-de-sac. Transvaginal technique was performed to assess early pregnancy. COMPARISON:  None. FINDINGS: Intrauterine gestational sac: Single Yolk sac:  Visualized. Embryo:  Visualized. Cardiac Activity: Visualized. Heart Rate: 119  bpm CRL:  3.7  mm   6 w   0 d                  [place] EDC: 09/08/2017 Subchorionic hemorrhage:  None visualized. Maternal uterus/adnexae: Right ovary is notable for a corpus luteal cyst. Left ovary is not discretely visualized, reportedly surgically absent. No free fluid. IMPRESSION: Single live intrauterine gestation, measuring 6 weeks 3 days by crown-rump length, as above. Electronically Signed   By: Charline Bills M.D.   On: 01/13/2017 13:56   US Ob Transvaginal  Result Date: 01/13/2017 CLINICAL DATA:  Pregnant, cramping, for viability/dating EXAM: OBSTETRIC <14 WK [place] AND TRANSVAGINAL OB US TECHNIQUE: Both transabdominal and transvaginal ultrasound examinations were performed for complete evaluation of the gestation as well as the maternal uterus, adnexal regions, and pelvic cul-de-sac. Transvaginal technique was performed to assess early pregnancy. COMPARISON:  None. FINDINGS: Intrauterine gestational sac: Single Yolk sac:  Visualized. Embryo:  Visualized. Cardiac Activity: Visualized. Heart Rate: 119  bpm CRL:  3.7  mm   6 w   0 d                  [place] EDC: 09/08/2017 Subchorionic hemorrhage:  None visualized. Maternal uterus/adnexae: Right ovary is notable for a corpus luteal cyst. Left ovary is not discretely visualized, reportedly surgically absent. No free fluid. IMPRESSION: Single live intrauterine gestation, measuring 6 weeks 3 days by crown-rump length, as above. Electronically Signed   By: Charline Bills M.D.   On: 01/13/2017 13:56     Assessment and Plan  26 yo G1P0 at [redacted]w[redacted]d with viable IUP - Ultrasound results reviewed with the patient - Rx phenergan and prenatal vitamins provided - List of ob providers given to start prenatal care -  Precautions reviewed  Melessa Cowell 01/13/2017, 2:03 PM

## 2017-01-13 NOTE — Discharge Instructions (Signed)
You have a positive pregnancy test. For nausea and vomiting have started you on a medicine called diclegis. Take 2 tablets every night at bedtime, after 2 days if needed can increase to 2 tablets twice a day. Also started on prenatal vitamins, one tablet daily. I have included the contact information for the women's outpatient clinic, they should contact you to schedule an appointment, but if you have not heard from them within 2 weeks, contact them to set up an appointment for prenatal care. If you have any abdominal pain pain, bleeding, or spotting, I [facility] and for ultrasound.

## 2017-01-13 NOTE — ED Provider Notes (Signed)
[facility]   [id number] [facility]   SUBJECTIVE:  Amber Hancock is a 26 y.o. female who presents to the urgent care with complaint of nausea, vomiting, and abdominal cramping that is been ongoing for 4 days. No diarrhea, no fever, or chills, denies exposure to any suspicious foods, no other person in her family has similar symptoms. Her last menstrual period was July 18, states that she has irregular periods, she is having unprotected intercourse and not on birth control. States she does not believe she could be pregnant, as she has been told she cannot have children. Her left ovary has been removed along with the fallopian tube, and has been told her uterus is "retroverted"     Past Medical History:  Diagnosis Date  . Anxiety   . Chronic pelvic pain in female   . Depression   . Fibromyalgia   . Streak gonad    Left   Social History   Social History  . Marital status: Single    Spouse name: N/A  . Number of children: N/A  . Years of education: N/A   Occupational History  . Not on file.   Social History Main Topics  . Smoking status: Never Smoker  . Smokeless tobacco: Never Used  . Alcohol use 0.0 - 1.2 oz/week     Comment: occasional  . Drug use: No  . Sexual activity: Yes    Partners: Male    Birth control/ protection: None   Other Topics Concern  . Not on file   Social History Narrative  . No narrative on file   No outpatient prescriptions have been marked as taking for the 01/13/17 encounter [facility](Hospital Encounter).   Allergies  Allergen Reactions  . Adhesive [Tape] Rash and Hives    Reaction to adhesive bandage after surgery Reaction to adhesive bandage after surgery  . Gabapentin Other (See Comments)    Increased anxiety.  Did things she doesn't remember doing. Other reaction(s): Other Increased anxiety.Did things she doesn't remember doing.  . Onion Hives  . Oxycodone-Acetaminophen Nausea And Vomiting  . Tylox  [Oxycodone-Acetaminophen] Nausea And Vomiting  . Vicodin [Hydrocodone-Acetaminophen] Nausea And Vomiting  . Other Hives    Reaction to adhesive bandage after surgery      ROS: As per HPI, remainder of ROS negative.   OBJECTIVE:  Vitals:   01/13/17 1113 01/13/17 1116  BP: 121/75   Pulse: 75   Resp: 16   Temp: 97.8 F (36.6 C)   TempSrc: Oral   SpO2: 100%   Weight:  121 lb (54.9 kg)       General Appearance:  awake, alert, oriented, in no acute distress, well developed, well nourished and in no acute distress Skin:  skin color, texture, turgor are normal Head/face:  NCAT Ears:  External- normal Back:  no pain to palpation, No CVA tenderness Lungs:  Normal expansion.  Clear to auscultation.  No rales, rhonchi, or wheezing. Heart:  Heart regular rate and rhythm Abdomen:  Soft, normal appearance, bowel sounds are present, no or dental megaly, there is slight discomfort in the suprapubic area with palpation Extremities: Extremities warm to touch, pink, with no edema. Peripheral Pulses:  Capillary refill <2secs, strong peripheral pulses Neurologic:  Alert and oriented     Labs:  Results for orders placed or performed during the hospital encounter of 01/13/17  Pregnancy, urine POC  Result Value Ref Range   Preg Test, Ur POSITIVE (A) NEGATIVE    Labs  Reviewed  POCT PREGNANCY, URINE - Abnormal; Notable for the following:       Result Value   Preg Test, Ur POSITIVE (*)    All other components within normal limits    No results found.     ASSESSMENT & PLAN:  1. Positive pregnancy test     Meds ordered this encounter  Medications  . ondansetron (ZOFRAN-ODT) disintegrating tablet 8 mg  . Doxylamine-Pyridoxine (DICLEGIS) 10-10 MG TBEC    Sig: Take 2 tablets by mouth at bedtime. After two days, may increase to 2 tablets twice a day    Dispense:  60 tablet    Refill:  0    Order Specific Question:   Supervising Provider    Answer:   Mardella Layman [[id number]]    . Prenatal Vit-Fe Fumarate-FA (PRENATAL COMPLETE) 14-0.4 MG TABS    Sig: Take 1 tablet by mouth daily.    Dispense:  60 each    Refill:  0    Order Specific Question:   Supervising Provider    Answer:   Mardella Layman [[id number]]    Pregnancy test positive, we'll treat today with Zofran, given prescription for likely just, and prenatal vitamins. Was sent to women's outpatient clinic for further evaluation and treatment, advised due to history, and wrist, there is any pain or discomfort, bleeding or spotting, or other abnormalities, to go to [facility] for further evaluation and management.  Reviewed expectations re: course of current medical issues. Questions answered. Outlined signs and symptoms indicating need for more acute intervention. Patient verbalized understanding. After Visit Summary given.    Procedures:        Dorena Bodo, NP 01/13/17 1231

## 2017-01-13 NOTE — ED Triage Notes (Signed)
PT reports nausea and vomiting for the last week. Condition is worse in the morning. PT has tried zantac and condition is improved, but not resolved.

## 2017-01-14 ENCOUNTER — Inpatient Hospital Stay (HOSPITAL_COMMUNITY)
Admission: AD | Admit: 2017-01-14 | Discharge: 2017-01-15 | Disposition: A | Discharge status: Home / Self Care | Payer: Medicaid Other | Source: Ambulatory Visit | Attending: Obstetrics and Gynecology | Admitting: Obstetrics and Gynecology

## 2017-01-14 ENCOUNTER — Encounter (HOSPITAL_COMMUNITY): Payer: Self-pay | Admitting: *Deleted

## 2017-01-14 DIAGNOSIS — Z3A01 Less than 8 weeks gestation of pregnancy: Secondary | ICD-10-CM | POA: Insufficient documentation

## 2017-01-14 DIAGNOSIS — O219 Vomiting of pregnancy, unspecified: Secondary | ICD-10-CM

## 2017-01-14 DIAGNOSIS — Z885 Allergy status to narcotic agent status: Secondary | ICD-10-CM | POA: Diagnosis not present

## 2017-01-14 DIAGNOSIS — O210 Mild hyperemesis gravidarum: Secondary | ICD-10-CM | POA: Insufficient documentation

## 2017-01-14 DIAGNOSIS — E860 Dehydration: Secondary | ICD-10-CM

## 2017-01-14 LAB — URINALYSIS, ROUTINE W REFLEX MICROSCOPIC
BACTERIA UA: NONE SEEN
BILIRUBIN URINE: NEGATIVE
Glucose, UA: NEGATIVE mg/dL
HGB URINE DIPSTICK: NEGATIVE
KETONES UR: 80 mg/dL — AB
NITRITE: NEGATIVE
PROTEIN: 100 mg/dL — AB
Specific Gravity, Urine: 1.027 (ref 1.005–1.030)
pH: 5 (ref 5.0–8.0)

## 2017-01-14 MED ORDER — PROMETHAZINE HCL 25 MG/ML IJ SOLN
25.0000 mg | Freq: Once | INTRAMUSCULAR | Status: AC
  Administered 2017-01-15: 25 mg via INTRAVENOUS
  Filled 2017-01-14: qty 1

## 2017-01-14 MED ORDER — LACTATED RINGERS IV SOLN
INTRAVENOUS | Status: DC
  Administered 2017-01-15: 01:00:00 via INTRAVENOUS

## 2017-01-14 NOTE — MAU Note (Signed)
Pt here with c/o nausea and vomiting. Requesting some phenergan and IV fluids, thinking she is "severely dehydrated"

## 2017-01-15 ENCOUNTER — Encounter (HOSPITAL_COMMUNITY): Payer: Self-pay | Admitting: *Deleted

## 2017-01-15 DIAGNOSIS — O219 Vomiting of pregnancy, unspecified: Secondary | ICD-10-CM

## 2017-01-15 LAB — CBC WITH DIFFERENTIAL/PLATELET
BASOS ABS: 0 10*3/uL (ref 0.0–0.1)
BASOS PCT: 0 %
EOS ABS: 0 10*3/uL (ref 0.0–0.7)
Eosinophils Relative: 0 %
HEMATOCRIT: 33.3 % — AB (ref 36.0–46.0)
Hemoglobin: 11.6 g/dL — ABNORMAL LOW (ref 12.0–15.0)
Lymphocytes Relative: 6 %
Lymphs Abs: 0.9 10*3/uL (ref 0.7–4.0)
MCH: 29.7 pg (ref 26.0–34.0)
MCHC: 34.8 g/dL (ref 30.0–36.0)
MCV: 85.2 fL (ref 78.0–100.0)
MONO ABS: 0.2 10*3/uL (ref 0.1–1.0)
Monocytes Relative: 1 %
NEUTROS ABS: 12.8 10*3/uL — AB (ref 1.7–7.7)
NEUTROS PCT: 93 %
Platelets: 246 10*3/uL (ref 150–400)
RBC: 3.91 MIL/uL (ref 3.87–5.11)
RDW: 13.2 % (ref 11.5–15.5)
WBC: 13.8 10*3/uL — ABNORMAL HIGH (ref 4.0–10.5)

## 2017-01-15 LAB — COMPREHENSIVE METABOLIC PANEL
ALT: 14 U/L (ref 14–54)
ANION GAP: 10 (ref 5–15)
AST: 18 U/L (ref 15–41)
Albumin: 3.8 g/dL (ref 3.5–5.0)
Alkaline Phosphatase: 32 U/L — ABNORMAL LOW (ref 38–126)
BILIRUBIN TOTAL: 0.9 mg/dL (ref 0.3–1.2)
BUN: 11 mg/dL (ref 6–20)
CHLORIDE: 108 mmol/L (ref 101–111)
CO2: 19 mmol/L — ABNORMAL LOW (ref 22–32)
Calcium: 8.3 mg/dL — ABNORMAL LOW (ref 8.9–10.3)
Creatinine, Ser: 0.66 mg/dL (ref 0.44–1.00)
GFR calc Af Amer: >60 mL/min (ref >60)
GFR calc non Af Amer: >60 mL/min (ref >60)
GLUCOSE: 93 mg/dL (ref 65–99)
POTASSIUM: 3.8 mmol/L (ref 3.5–5.1)
SODIUM: 137 mmol/L (ref 135–145)
TOTAL PROTEIN: 6.6 g/dL (ref 6.5–8.1)

## 2017-01-15 LAB — AMYLASE: Amylase: 29 U/L (ref 28–100)

## 2017-01-15 LAB — LIPASE, BLOOD: Lipase: 25 U/L (ref 11–51)

## 2017-01-15 MED ORDER — DEXAMETHASONE SODIUM PHOSPHATE 10 MG/ML IJ SOLN
10.0000 mg | Freq: Once | INTRAMUSCULAR | Status: AC
  Administered 2017-01-15: 10 mg via INTRAVENOUS
  Filled 2017-01-15: qty 1

## 2017-01-15 MED ORDER — METOCLOPRAMIDE HCL 10 MG PO TABS: 10.0000 mg | ORAL_TABLET | Freq: Four times a day (QID) | ORAL | 0 refills | Status: DC | PRN

## 2017-01-15 MED ORDER — METOCLOPRAMIDE HCL 5 MG/ML IJ SOLN
10.0000 mg | Freq: Once | INTRAMUSCULAR | Status: AC
  Administered 2017-01-15: 10 mg via INTRAVENOUS
  Filled 2017-01-15: qty 2

## 2017-01-15 MED ORDER — SODIUM CHLORIDE 0.9 % IV SOLN
INTRAVENOUS | Status: DC
  Administered 2017-01-15: 02:00:00 via INTRAVENOUS

## 2017-01-15 NOTE — MAU Provider Note (Signed)
Chief Complaint: Emesis and Nausea   First Provider Initiated Contact with Patient 01/15/17 0005        SUBJECTIVE HPI: Amber Hancock is a 26 y.o. G1P0 at [redacted]w[redacted]d by LMP who presents to maternity admissions reporting nausea, vomiting and feeling dehydrated.  Was given Rx for Phenergan but cannot keep it down.  Couldn't afford Diclegis She denies vaginal bleeding, vaginal itching/burning, urinary symptoms, h/a, dizziness, or fever/chills.     Emesis   This is a recurrent problem. The current episode started in the past 7 days. The problem occurs 5 to 10 times per day. The problem has been unchanged. There has been no fever. Associated symptoms include weight loss. Pertinent negatives include no abdominal pain, chest pain, chills, coughing, diarrhea, dizziness, fever or headaches. Treatments tried: cannot keep down Phenergan.    RN Note: Pt here with c/o nausea and vomiting. Requesting some phenergan and IV fluids, thinking she is "severely dehydrated  Past Medical History:  Diagnosis Date  . Anxiety   . Chronic pelvic pain in female   . Depression   . Fibromyalgia   . Streak gonad    Left   Past Surgical History:  Procedure Laterality Date  . ABDOMINAL HYSTERECTOMY    . LAPAROTOMY    . SALPINGOOPHORECTOMY     Social History   Social History  . Marital status: Single    Spouse name: N/A  . Number of children: N/A  . Years of education: N/A   Occupational History  . Not on file.   Social History Main Topics  . Smoking status: Never Smoker  . Smokeless tobacco: Never Used  . Alcohol use 0.0 - 1.2 oz/week     Comment: occasional  . Drug use: No  . Sexual activity: Yes    Partners: Male    Birth control/ protection: None   Other Topics Concern  . Not on file   Social History Narrative  . No narrative on file   No current facility-administered medications on file prior to encounter.    Current Outpatient Prescriptions on File Prior to Encounter  Medication Sig  Dispense Refill  . cetirizine (ZYRTEC) 10 MG tablet Take 10 mg by mouth daily.  0  . Doxylamine-Pyridoxine (DICLEGIS) 10-10 MG TBEC Take 2 tablets by mouth at bedtime. After two days, may increase to 2 tablets twice a day 60 tablet 0  . EPINEPHrine (AUVI-Q) 0.3 mg/0.3 mL IJ SOAJ injection Use as directed for severe allergic reaction 2 Device 1  . folic acid (FOLVITE) 1 MG tablet Take 1 tablet (1 mg total) by mouth daily. 30 tablet 10  . lamoTRIgine (LAMICTAL) 25 MG tablet Take 2 tablets (50 mg total) by mouth daily. 60 tablet 1  . Prenatal Vit-Fe Fumarate-FA (PRENATAL COMPLETE) 14-0.4 MG TABS Take 1 tablet by mouth daily. 60 each 0  . promethazine (PHENERGAN) 25 MG tablet Take 1 tablet (25 mg total) by mouth every 6 (six) hours as needed for nausea or vomiting. 30 tablet 2   Allergies  Allergen Reactions  . Adhesive [Tape] Rash and Hives    Reaction to adhesive bandage after surgery Reaction to adhesive bandage after surgery  . Gabapentin Other (See Comments)    Increased anxiety.  Did things she doesn't remember doing. Other reaction(s): Other Increased anxiety.Did things she doesn't remember doing.  . Onion Hives  . Oxycodone-Acetaminophen Nausea And Vomiting  . Tylox [Oxycodone-Acetaminophen] Nausea And Vomiting  . Vicodin [Hydrocodone-Acetaminophen] Nausea And Vomiting  . Other Hives  Reaction to adhesive bandage after surgery    I have reviewed patient's Past Medical Hx, Surgical Hx, Family Hx, Social Hx, medications and allergies.   ROS:  Review of Systems  Constitutional: Positive for weight loss. Negative for chills and fever.  Respiratory: Negative for cough.   Cardiovascular: Negative for chest pain.  Gastrointestinal: Positive for vomiting. Negative for abdominal pain and diarrhea.  Neurological: Negative for dizziness and headaches.   Review of Systems  Other systems negative   Physical Exam  Physical Exam Patient Vitals for the past 24 hrs:  BP Temp Temp  src Pulse Resp SpO2 Height Weight  01/14/17 2327 107/74 97.7 F (36.5 C) Oral (!) 106 18 100 % 5\' 5"  (1.651 m) 123 lb (55.8 kg)   Constitutional: Well-developed, well-nourished female in no acute distress, but actively vomiting  Cardiovascular: mild tachycardia Respiratory: normal effort GI: Abd soft, non-tender. Pos BS x 4 MS: Extremities nontender, no edema, normal ROM Neurologic: Alert and oriented x 4.  GU: Neg CVAT.  PELVIC EXAM: deferred  LAB RESULTS Results for orders placed or performed during the hospital encounter of 01/14/17 (from the past 24 hour(s))  Urinalysis, Routine w reflex microscopic     Status: Abnormal   Collection Time: 01/14/17 11:30 PM  Result Value Ref Range   Color, Urine YELLOW YELLOW   APPearance HAZY (A) CLEAR   Specific Gravity, Urine 1.027 1.005 - 1.030   pH 5.0 5.0 - 8.0   Glucose, UA NEGATIVE NEGATIVE mg/dL   Hgb urine dipstick NEGATIVE NEGATIVE   Bilirubin Urine NEGATIVE NEGATIVE   Ketones, ur 80 (A) NEGATIVE mg/dL   Protein, ur 161100 (A) NEGATIVE mg/dL   Nitrite NEGATIVE NEGATIVE   Leukocytes, UA TRACE (A) NEGATIVE   RBC / HPF 0-5 0 - 5 RBC/hpf   WBC, UA 0-5 0 - 5 WBC/hpf   Bacteria, UA NONE SEEN NONE SEEN   Squamous Epithelial / LPF 6-30 (A) NONE SEEN   Mucus PRESENT        IMAGING  MAU Management/MDM: IV hydration given x 3 liters Phenergan given with only small amount of relief Decadron added Reglan added, with good relief of nausea Feels much better Plan adding Reglan and using Phenergan vaginally as needed if cannot keep pill down  ASSESSMENT No diagnosis found.  PLAN Discharge home Rx Reglan for nausea/vomiting May use Phenergan vaginally as needed Advance diet as tolerated   Pt stable at time of discharge. Encouraged to return here or to other Urgent Care/ED if she develops worsening of symptoms, increase in pain, fever, or other concerning symptoms.    Wynelle BourgeoisMarie Cricket Goodlin CNM, MSN Certified Nurse-Midwife 01/15/2017   1:45 AM

## 2017-02-11 LAB — OB RESULTS CONSOLE GC/CHLAMYDIA
Chlamydia: NEGATIVE
Gonorrhea: NEGATIVE

## 2017-02-11 LAB — OB RESULTS CONSOLE HEPATITIS B SURFACE ANTIGEN: Hepatitis B Surface Ag: NEGATIVE

## 2017-02-11 LAB — OB RESULTS CONSOLE RUBELLA ANTIBODY, IGM: Rubella: IMMUNE

## 2017-02-11 LAB — OB RESULTS CONSOLE HIV ANTIBODY (ROUTINE TESTING): HIV: NONREACTIVE

## 2017-02-11 LAB — OB RESULTS CONSOLE ABO/RH: RH TYPE: POSITIVE

## 2017-02-11 LAB — OB RESULTS CONSOLE RPR: RPR: NONREACTIVE

## 2017-02-11 LAB — OB RESULTS CONSOLE VARICELLA ZOSTER ANTIBODY, IGG: VARICELLA IGG: IMMUNE

## 2017-02-11 LAB — OB RESULTS CONSOLE ANTIBODY SCREEN: ANTIBODY SCREEN: NEGATIVE

## 2017-02-22 ENCOUNTER — Other Ambulatory Visit: Payer: Self-pay

## 2017-02-25 ENCOUNTER — Encounter (HOSPITAL_COMMUNITY): Payer: Self-pay

## 2017-02-26 ENCOUNTER — Encounter (HOSPITAL_COMMUNITY): Payer: Self-pay

## 2017-02-26 ENCOUNTER — Ambulatory Visit (HOSPITAL_COMMUNITY)
Admission: RE | Admit: 2017-02-26 | Discharge: 2017-02-26 | Disposition: A | Discharge status: Home / Self Care | Payer: Self-pay | Source: Ambulatory Visit | Attending: Obstetrics and Gynecology | Admitting: Obstetrics and Gynecology

## 2017-02-26 HISTORY — DX: Unspecified ovarian cyst, right side: N83.202

## 2017-02-26 HISTORY — DX: Headache: R51

## 2017-02-26 HISTORY — DX: Unspecified abnormal cytological findings in specimens from vagina: R87.629

## 2017-02-26 HISTORY — DX: Headache, unspecified: R51.9

## 2017-02-26 HISTORY — DX: Unspecified ovarian cyst, right side: N83.201

## 2017-03-10 ENCOUNTER — Encounter: Payer: Self-pay | Admitting: Advanced Practice Midwife

## 2017-03-17 ENCOUNTER — Other Ambulatory Visit: Payer: Self-pay | Admitting: Obstetrics and Gynecology

## 2017-06-24 ENCOUNTER — Other Ambulatory Visit: Payer: Self-pay | Admitting: Obstetrics and Gynecology

## 2017-08-09 ENCOUNTER — Inpatient Hospital Stay (HOSPITAL_COMMUNITY)
Admission: AD | Admit: 2017-08-09 | Discharge: 2017-08-10 | DRG: 833 | Disposition: A | Discharge status: Home / Self Care | Payer: Medicaid Other | Source: Ambulatory Visit | Attending: Obstetrics and Gynecology | Admitting: Obstetrics and Gynecology

## 2017-08-09 ENCOUNTER — Encounter (HOSPITAL_COMMUNITY): Payer: Self-pay

## 2017-08-09 ENCOUNTER — Encounter (HOSPITAL_COMMUNITY): Payer: Self-pay | Admitting: Anesthesiology

## 2017-08-09 DIAGNOSIS — O26893 Other specified pregnancy related conditions, third trimester: Secondary | ICD-10-CM | POA: Diagnosis present

## 2017-08-09 DIAGNOSIS — O212 Late vomiting of pregnancy: Secondary | ICD-10-CM | POA: Diagnosis not present

## 2017-08-09 DIAGNOSIS — R51 Headache: Secondary | ICD-10-CM | POA: Diagnosis present

## 2017-08-09 DIAGNOSIS — Z3A35 35 weeks gestation of pregnancy: Secondary | ICD-10-CM

## 2017-08-09 LAB — COMPREHENSIVE METABOLIC PANEL
ALK PHOS: 177 U/L — AB (ref 38–126)
ALT: 14 U/L (ref 14–54)
ANION GAP: 11 (ref 5–15)
AST: 22 U/L (ref 15–41)
Albumin: 2.9 g/dL — ABNORMAL LOW (ref 3.5–5.0)
BILIRUBIN TOTAL: 0.4 mg/dL (ref 0.3–1.2)
BUN: 7 mg/dL (ref 6–20)
CO2: 20 mmol/L — ABNORMAL LOW (ref 22–32)
CREATININE: 0.76 mg/dL (ref 0.44–1.00)
Calcium: 8.5 mg/dL — ABNORMAL LOW (ref 8.9–10.3)
Chloride: 106 mmol/L (ref 101–111)
Glucose, Bld: 77 mg/dL (ref 65–99)
Potassium: 3.5 mmol/L (ref 3.5–5.1)
Sodium: 137 mmol/L (ref 135–145)
TOTAL PROTEIN: 6.1 g/dL — AB (ref 6.5–8.1)

## 2017-08-09 LAB — CBC
HEMATOCRIT: 36 % (ref 36.0–46.0)
HEMOGLOBIN: 12.2 g/dL (ref 12.0–15.0)
MCH: 29 pg (ref 26.0–34.0)
MCHC: 33.9 g/dL (ref 30.0–36.0)
MCV: 85.5 fL (ref 78.0–100.0)
Platelets: 188 10*3/uL (ref 150–400)
RBC: 4.21 MIL/uL (ref 3.87–5.11)
RDW: 14.5 % (ref 11.5–15.5)
WBC: 12.1 10*3/uL — ABNORMAL HIGH (ref 4.0–10.5)

## 2017-08-09 LAB — URINALYSIS, ROUTINE W REFLEX MICROSCOPIC
BILIRUBIN URINE: NEGATIVE
GLUCOSE, UA: NEGATIVE mg/dL
Hgb urine dipstick: NEGATIVE
KETONES UR: NEGATIVE mg/dL
Leukocytes, UA: NEGATIVE
NITRITE: NEGATIVE
PH: 7 (ref 5.0–8.0)
Protein, ur: NEGATIVE mg/dL
Specific Gravity, Urine: 1.008 (ref 1.005–1.030)

## 2017-08-09 LAB — PROTEIN / CREATININE RATIO, URINE
Creatinine, Urine: 52 mg/dL
PROTEIN CREATININE RATIO: 0.15 mg/mg{creat} (ref 0.00–0.15)
TOTAL PROTEIN, URINE: 8 mg/dL

## 2017-08-09 LAB — TYPE AND SCREEN
ABO/RH(D): AB POS
Antibody Screen: NEGATIVE

## 2017-08-09 MED ORDER — LIDOCAINE HCL (PF) 1 % IJ SOLN
30.0000 mL | INTRAMUSCULAR | Status: DC | PRN
  Filled 2017-08-09: qty 30

## 2017-08-09 MED ORDER — EPHEDRINE 5 MG/ML INJ
10.0000 mg | INTRAVENOUS | Status: DC | PRN
  Filled 2017-08-09: qty 2

## 2017-08-09 MED ORDER — DEXAMETHASONE SODIUM PHOSPHATE 10 MG/ML IJ SOLN
10.0000 mg | Freq: Once | INTRAMUSCULAR | Status: AC
  Administered 2017-08-09: 10 mg via INTRAVENOUS
  Filled 2017-08-09: qty 1

## 2017-08-09 MED ORDER — PHENYLEPHRINE 40 MCG/ML (10ML) SYRINGE FOR IV PUSH (FOR BLOOD PRESSURE SUPPORT)
80.0000 ug | PREFILLED_SYRINGE | INTRAVENOUS | Status: DC | PRN
  Filled 2017-08-09: qty 5
  Filled 2017-08-09: qty 10

## 2017-08-09 MED ORDER — METOCLOPRAMIDE HCL 5 MG/ML IJ SOLN
10.0000 mg | Freq: Once | INTRAMUSCULAR | Status: AC
  Administered 2017-08-09: 10 mg via INTRAVENOUS
  Filled 2017-08-09: qty 2

## 2017-08-09 MED ORDER — NIFEDIPINE 10 MG PO CAPS
20.0000 mg | ORAL_CAPSULE | Freq: Once | ORAL | Status: AC
  Administered 2017-08-09: 20 mg via ORAL
  Filled 2017-08-09: qty 2

## 2017-08-09 MED ORDER — ONDANSETRON HCL 4 MG/2ML IJ SOLN
4.0000 mg | Freq: Four times a day (QID) | INTRAMUSCULAR | Status: DC | PRN
Start: 2017-08-09 — End: 2017-08-10

## 2017-08-09 MED ORDER — FENTANYL 2.5 MCG/ML BUPIVACAINE 1/10 % EPIDURAL INFUSION (WH - ANES)
14.0000 mL/h | INTRAMUSCULAR | Status: DC | PRN
  Filled 2017-08-09: qty 100

## 2017-08-09 MED ORDER — LACTATED RINGERS IV SOLN
INTRAVENOUS | Status: DC
  Administered 2017-08-09: 19:00:00 via INTRAVENOUS

## 2017-08-09 MED ORDER — LACTATED RINGERS IV SOLN
500.0000 mL | Freq: Once | INTRAVENOUS | Status: AC
  Administered 2017-08-09: 19:00:00 via INTRAVENOUS

## 2017-08-09 MED ORDER — SOD CITRATE-CITRIC ACID 500-334 MG/5ML PO SOLN: 30.0000 mL | ORAL | Status: DC | PRN

## 2017-08-09 MED ORDER — PROMETHAZINE HCL 25 MG/ML IJ SOLN
12.5000 mg | Freq: Once | INTRAMUSCULAR | Status: AC
  Administered 2017-08-09: 12.5 mg via INTRAVENOUS
  Filled 2017-08-09: qty 1

## 2017-08-09 MED ORDER — PANTOPRAZOLE SODIUM 40 MG PO TBEC
40.0000 mg | DELAYED_RELEASE_TABLET | Freq: Every day | ORAL | Status: DC
  Administered 2017-08-09: 40 mg via ORAL
  Filled 2017-08-09: qty 1

## 2017-08-09 MED ORDER — BETAMETHASONE SOD PHOS & ACET 6 (3-3) MG/ML IJ SUSP
12.0000 mg | INTRAMUSCULAR | Status: DC
  Administered 2017-08-09: 12 mg via INTRAMUSCULAR
  Filled 2017-08-09 (×2): qty 2

## 2017-08-09 MED ORDER — OXYTOCIN BOLUS FROM INFUSION: 500.0000 mL | Freq: Once | INTRAVENOUS | Status: DC

## 2017-08-09 MED ORDER — LACTATED RINGERS IV SOLN
500.0000 mL | INTRAVENOUS | Status: DC | PRN
  Administered 2017-08-09: 300 mL via INTRAVENOUS

## 2017-08-09 MED ORDER — DIPHENHYDRAMINE HCL 50 MG/ML IJ SOLN: 12.5000 mg | INTRAMUSCULAR | Status: DC | PRN

## 2017-08-09 MED ORDER — HYDROXYZINE HCL 50 MG/ML IM SOLN
50.0000 mg | Freq: Four times a day (QID) | INTRAMUSCULAR | Status: DC | PRN
  Filled 2017-08-09: qty 1

## 2017-08-09 MED ORDER — DIPHENHYDRAMINE HCL 50 MG/ML IJ SOLN
25.0000 mg | Freq: Once | INTRAMUSCULAR | Status: AC
  Administered 2017-08-09: 25 mg via INTRAVENOUS
  Filled 2017-08-09: qty 1

## 2017-08-09 MED ORDER — FENTANYL CITRATE (PF) 100 MCG/2ML IJ SOLN
100.0000 ug | INTRAMUSCULAR | Status: DC | PRN
  Administered 2017-08-09: 100 ug via INTRAVENOUS
  Filled 2017-08-09 (×2): qty 2

## 2017-08-09 MED ORDER — OXYTOCIN 40 UNITS IN LACTATED RINGERS INFUSION - SIMPLE MED: 2.5000 [IU]/h | INTRAVENOUS | Status: DC

## 2017-08-09 MED ORDER — FENTANYL CITRATE (PF) 100 MCG/2ML IJ SOLN
100.0000 ug | Freq: Once | INTRAMUSCULAR | Status: AC
  Administered 2017-08-09: 100 ug via INTRAVENOUS
  Filled 2017-08-09: qty 2

## 2017-08-09 MED ORDER — LACTATED RINGERS IV BOLUS
250.0000 mL | Freq: Once | INTRAVENOUS | Status: AC
  Administered 2017-08-09: 250 mL via INTRAVENOUS

## 2017-08-09 MED ORDER — LACTATED RINGERS IV SOLN
Freq: Once | INTRAVENOUS | Status: AC
  Administered 2017-08-09: 11:00:00 via INTRAVENOUS

## 2017-08-09 MED ORDER — TERBUTALINE SULFATE 1 MG/ML IJ SOLN
0.2500 mg | Freq: Once | INTRAMUSCULAR | Status: DC
  Filled 2017-08-09: qty 1

## 2017-08-09 MED ORDER — LACTATED RINGERS IV SOLN
INTRAVENOUS | Status: DC
  Administered 2017-08-09 (×2): via INTRAVENOUS

## 2017-08-09 MED ORDER — NIFEDIPINE 10 MG PO CAPS
10.0000 mg | ORAL_CAPSULE | ORAL | Status: AC | PRN
  Administered 2017-08-09 (×3): 10 mg via ORAL
  Filled 2017-08-09 (×3): qty 1

## 2017-08-09 NOTE — Progress Notes (Signed)
S:  Pt states not feeling contractions.  Abd sore from having them all day.  FM+ O VSS  FHTS 135 positive variability accels present, no decels Ctx spaced to occ A:  Preterm contractions resolved Cat 1 strip P  Transfer to antenatal, will discharge after second steriod dose.  Discussed with pt and family poc.  Agreeable. Declined ambien to sleep.  Will do NST.

## 2017-08-09 NOTE — Progress Notes (Signed)
Subjective: Pt states headache is gone, anxious about being in labor, states still feeling some cramping, feels like baby is low. Denies leakage of fluid or bloody show.  FM+.  Pt declines Vistaril.  Pt is hungry and would like to eat.  Objective: BP 112/67   Pulse (!) 121   Temp 98.4 F (36.9 C) (Oral)   Resp 16   Ht 5\' 3"  (1.6 m)   Wt 68.4 kg (150 lb 12 oz)   LMP 11/23/2016   SpO2 95%   BMI 26.70 kg/m  No intake/output data recorded. No intake/output data recorded.  FHT: Category 1  Fht BL 145 pos variability, accels noted, no decels UC:   regular, every 2-5 minutes  Mild to palpation, SVE:   Dilation: 2 Effacement (%): 50 Station: -3 Exam by:: Prothero CNM  Assessment:  SIUP at 35+5 weeks in latent phase vs false labor BMZ #1 received at 14:50 Cat 1 strip  Plan: Dr. Estanislado Pandyivard reviewed and discussed with Dr Archie PattenBunce, MFM on call: does not recommend Magnesium sulfate at this time. OK to proceed with Epidural for rest, pain management and further evaluation of labor. Should labor progress, proceed with c/s Anesthesiologist not comfortable with doing an epidural for possible rest.  Discussed with Dr. Mora ApplPinn.  Will use IV pain medication for pain management. Dr. Mora ApplPinn updated on no cervical pain and pt wishing to eat.  Also aware pt declined Vistaril.  Will monitor pt if contractions resolve will move to antepartum until pt receives second dose of steroids.   Kenney HousemanNancy Jean Prothero CNM, MSN 08/09/2017, 9:47 PM

## 2017-08-09 NOTE — Anesthesia Preprocedure Evaluation (Deleted)
Anesthesia Evaluation Anesthesia Physical Anesthesia Plan Anesthesia Quick Evaluation  

## 2017-08-09 NOTE — Progress Notes (Signed)
S: headache resolved, PIH labs normal      Ctx persist despite 3 doses of procardia every 3 minutes and uncomfortable  O: FHT Category 1      VE: closed, posterior, 30%, -2  A: no evidence of pre-eclampsia     No evidence of labor  P: IV fentanyl with Phenergan for maternal rest     BMZ     Will reevaluate in a few hours

## 2017-08-09 NOTE — MAU Provider Note (Addendum)
History     CSN: [id number]  Arrival date and time: 08/09/17 [id number]   First Provider Initiated Contact with Patient 08/09/17 1012      Chief Complaint  Patient presents with  . Emesis  . Nausea  . Headache  . Chest Pain   HPI Amber Hancock 27 y.o. 3061w5d  Comes to MAU with nausea and vomiting this morning.  She is experiencing symptoms that could be high blood pressure and when she called the office, she was told to come in for evaluation.  Has had a headache since yesterday and tried Tylenol but it did not relieve the headache.  It is constantly a 8-9/10 and has awakened her through the night.  Her feet were swollen this morning so she took a bath to soak them.  She has vomited twice but has been able to keep down some water since the vomiting.  She still feels nauseated - did not try any medication for nausea but has been talking Phenergan fairly regularly for nausea - took Phenergan last on yesterday.  Has some chest tightness and feels like her heart rate is faster but thinks it may be anxiety.  Reports her headache as being the most bothersome problem - has had floaters, had some vision disturbances when she is looking at her phone screen and is very sensitive to light with this headache.  Her next office visit is scheduled for Tuesday.  OB History    Gravida  1   Para      Term      Preterm      AB      Living        SAB      TAB      Ectopic      Multiple      Live Births              Past Medical History:  Diagnosis Date  . Anxiety   . Bilateral ovarian cysts   . Chronic pelvic pain in female   . Depression   . Fibromyalgia   . Headache   . Streak gonad    Left  . Vaginal Pap smear, abnormal     Past Surgical History:  Procedure Laterality Date  . LAPAROTOMY    . SALPINGOOPHORECTOMY      Family History  Problem Relation Age of Onset  . Hypertension Other        Malignant  . Diverticulitis Mother   . Hypertension Mother   . Cancer  Paternal Grandmother   . Anesthesia problems Paternal Grandmother   . Post-traumatic stress disorder Father   . Eczema Father   . Mental illness Brother   . Depression Sister   . Allergic rhinitis Neg Hx   . Asthma Neg Hx   . Angioedema Neg Hx   . Urticaria Neg Hx     Social History   Tobacco Use  . Smoking status: Never Smoker  . Smokeless tobacco: Never Used  Substance Use Topics  . Alcohol use: Not Currently    Alcohol/week: 0.0 - 1.2 oz    Comment: occasional  . Drug use: No    Allergies:  Allergies  Allergen Reactions  . Adhesive [Tape] Rash and Hives    Reaction to adhesive bandage after surgery Reaction to adhesive bandage after surgery  . Gabapentin Other (See Comments)    Increased anxiety.  Did things she doesn't remember doing. Other reaction(s): Other Increased anxiety.Did things she doesn't remember  doing.  . Onion Hives  . Oxycodone-Acetaminophen Nausea And Vomiting  . Tylox [Oxycodone-Acetaminophen] Nausea And Vomiting  . Vicodin [Hydrocodone-Acetaminophen] Nausea And Vomiting  . Other Hives    Reaction to adhesive bandage after surgery    Medications Prior to Admission  Medication Sig Dispense Refill Last Dose  . folic acid (FOLVITE) 1 MG tablet Take 1 tablet (1 mg total) by mouth daily. 30 tablet 10 08/08/2017 at Unknown time  . metoCLOPramide (REGLAN) 10 MG tablet Take 1 tablet (10 mg total) by mouth every 6 (six) hours as needed for nausea. 30 tablet 0 Past Month at Unknown time  . Prenatal Vit-Fe Fumarate-FA (PRENATAL COMPLETE) 14-0.4 MG TABS Take 1 tablet by mouth daily. 60 each 0 08/08/2017 at Unknown time  . promethazine (PHENERGAN) 25 MG tablet TAKE 1 TABLET (25 MG TOTAL) BY MOUTH EVERY 6 (SIX) HOURS AS NEEDED FOR NAUSEA OR VOMITING. 30 tablet 2 Past Week at Unknown time  . Doxylamine-Pyridoxine (DICLEGIS) 10-10 MG TBEC Take 2 tablets by mouth at bedtime. After two days, may increase to 2 tablets twice a day (Patient not taking: Reported on  08/09/2017) 60 tablet 0 Not Taking  . EPINEPHrine (AUVI-Q) 0.3 mg/0.3 mL IJ SOAJ injection Use as directed for severe allergic reaction 2 Device 1 Emergency  . lamoTRIgine (LAMICTAL) 25 MG tablet Take 2 tablets (50 mg total) by mouth daily. (Patient not taking: Reported on 08/09/2017) 60 tablet 1 Not Taking at Unknown time    Review of Systems  Constitutional: Negative for fever.  Eyes: Positive for photophobia and visual disturbance.  Respiratory: Positive for chest tightness.   Cardiovascular:       Swelling of feet Heart racing  Gastrointestinal: Positive for nausea and vomiting. Negative for abdominal pain and diarrhea.  Neurological: Positive for headaches. Negative for syncope.   Physical Exam   Blood pressure 134/87, pulse 95, temperature 98.2 F (36.8 C), temperature source Oral, resp. rate 18, height 5\' 3"  (1.6 m), weight 150 lb 12 oz (68.4 kg), last menstrual period 11/23/2016.  Physical Exam  Nursing note and vitals reviewed. Constitutional: She is oriented to person, place, and time. She appears well-developed and well-nourished.  Room is darkened  HENT:  Head: Normocephalic.  Eyes: EOM are normal.  Neck: Neck supple.  Cardiovascular: Normal rate, regular rhythm and normal heart sounds.  Heart rate 95-105  Respiratory: Effort normal and breath sounds normal.  GI: Soft. There is no tenderness. There is no rebound and no guarding.  FHT baseline is 130 with moderate variability and 15x15 accels.  Occasional variable decel noted with quick recovery to baseline.  Contractions were 8 minutes apart and while in MAU became 2-3 minutes apart.  Musculoskeletal: Normal range of motion.  No edema in feet or ankles  Neurological: She is alert and oriented to person, place, and time.  Skin: Skin is warm and dry.  Psychiatric: She has a normal mood and affect.    MAU Course  Procedures  MDM Will do labs and evaluate for preeclampsia - BP in normal range but near to elevated.   Will give IVF and medications for headache.   After IVF with Benadryl, Reglan and Decadron, headache has been relieved.  Is having contractions now every 2-3 minutes that she is aware are happening but they are not painful.  Cervical check - vertex at -2 , cervix is posterior and closed.  Consult with Dr. Estanislado Pandy and will give Procardia to help contractions stop. Dr. Estanislado Pandy notified - contractions have  not stopped with procardia - came to see client and checked her cervix - no change. Will give medication for client to rest.  Dr. Estanislado Pandy to be notified of her response. Care assumed by E. Zeyna Mkrtchyan, NP at 1600  Assessment and Plan  Headache which resolved with IVF and medication Threatened preterm labor  Amber Hancock 08/09/2017, 10:23 AM

## 2017-08-09 NOTE — H&P (Signed)
Amber Hancock is a 27 y.o. female,G1P0 at 35+5 weeks, who presented today with c/o H/A and edema. PIH work up negative and BP normal.  Started regular contractions while here and did not respond to IV fluids, multiple doses of Nifedipine, IV Fentanyl/Phenergan. Is now contracting every 2 minutes, 60 seconds,intensity 7/10 per patient. Good FM. No bleeding or LOF  Pregnancy followed at [facility] since 10+1  weeks and remarkable for:  1. Anxiety disorder : no current meds 2. Depression: no current meds 3. Fibromyalgia 4. Chronic pelvic pain / myofasciitis: has requested a primary cesarean section as mode of delivery for concerns of worsening of chronic pain syndrome. Fully reviewed and scheduled 09/01/17 with Dr Su Hiltoberts  OB History    Gravida  1   Para      Term      Preterm      AB      Living        SAB      TAB      Ectopic      Multiple      Live Births             Past Medical History:  Diagnosis Date  . Anxiety   . Bilateral ovarian cysts   . Chronic pelvic pain in female   . Depression   . Fibromyalgia   . Headache   . Streak gonad    Left  . Vaginal Pap smear, abnormal    Past Surgical History:  Procedure Laterality Date  . LAPAROTOMY    . SALPINGOOPHORECTOMY      Family History:   family history includes Anesthesia problems in her paternal grandmother; Cancer in her paternal grandmother; Depression in her sister; Diverticulitis in her mother; Eczema in her father; Hypertension in her mother and other; Mental illness in her brother; Post-traumatic stress disorder in her father. Social History:    reports that she has never smoked. She has never used smokeless tobacco. She reports that she drank alcohol. She reports that she does not use drugs.   Prenatal labs: ABO, Rh:  AB+ Antibody:  negative Rubella:  immune RPR:   NR HBsAg:  negative  HIV:   NR GBS:   unknown   Prenatal Transfer Tool  Maternal Diabetes: No Genetic Screening: Normal  Panorama: low risk female, normal AFP Maternal Ultrasounds/Referrals: Normal appropriately dated pregnancy Fetal Ultrasounds or other Referrals:  None Maternal Substance Abuse:  No Significant Maternal Medications:  none Significant Maternal Lab Results: None   Dilation: 2 Effacement (%): 50 Exam by:: n druebbisch rn Blood pressure 127/74, pulse (!) 125, temperature 98.5 F (36.9 C), temperature source Oral, resp. rate 18, height 5\' 3"  (1.6 m), weight 68.4 kg (150 lb 12 oz), last menstrual period 11/23/2016, SpO2 95 %.  General Appearance: Alert, appropriate appearance for age. No acute distress HEENT Exam: Grossly normal Chest/Respiratory Exam: Normal chest wall and respirations. Clear to auscultation  Cardiovascular Exam: Regular rate and rhythm. S1, S2, no murmur Gastrointestinal Exam: soft, non-tender, Uterus gravid with size compatible with GA, Vertex presentation by Leopold's maneuvers Psychiatric Exam: Alert and oriented, appropriate affect  ++++++++++++++++++++++++++++++++++++++++++++++++++++++++++++++++  Fetal tracings: Category 1  ++++++++++++++++++++++++++++++++++++++++++++++++++++++++++++++++   Assessment/Plan:  SIUP at 35+5 weeks in latent phase BMZ #1 received at 14:50 Reviewed and discussed with Dr Archie PattenBunce, MFM on call: does not recommend Magnesium sulfate at this time. OK to proceed with Epidural for rest, pain management and further evaluation of labor. Should labor progress, proceed with c/s Plan  of care reviewed with patient and husband who are agreeable. Questions answered  Silverio Lay MD 08/09/2017, 5:57 PM

## 2017-08-09 NOTE — MAU Note (Addendum)
Headache since last night that was not relieved with tylenol. Nausea since last night and vom x 2 this morning. No vaginal bleeding, no LOF. +FM. No ctx. Also reports some chest tightness since last night- unsure if related to anxiety.

## 2017-08-10 ENCOUNTER — Other Ambulatory Visit: Payer: Self-pay

## 2017-08-10 LAB — ABO/RH: ABO/RH(D): AB POS

## 2017-08-10 LAB — RPR: RPR: NONREACTIVE

## 2017-08-10 MED ORDER — LACTATED RINGERS IV SOLN
INTRAVENOUS | Status: DC
  Administered 2017-08-10: 01:00:00 via INTRAVENOUS

## 2017-08-10 MED ORDER — FENTANYL CITRATE (PF) 100 MCG/2ML IJ SOLN
100.0000 ug | Freq: Once | INTRAMUSCULAR | Status: AC
  Administered 2017-08-10: 100 ug via INTRAVENOUS

## 2017-08-10 MED ORDER — CYCLOBENZAPRINE HCL 10 MG PO TABS: 10.0000 mg | ORAL_TABLET | Freq: Three times a day (TID) | ORAL | 1 refills | Status: DC | PRN

## 2017-08-10 MED ORDER — BETAMETHASONE SOD PHOS & ACET 6 (3-3) MG/ML IJ SUSP
12.0000 mg | Freq: Once | INTRAMUSCULAR | Status: AC
  Administered 2017-08-10: 12 mg via INTRAMUSCULAR
  Filled 2017-08-10: qty 2

## 2017-08-10 MED ORDER — NIFEDIPINE 10 MG PO CAPS: 10.0000 mg | ORAL_CAPSULE | Freq: Four times a day (QID) | ORAL | 1 refills | Status: DC | PRN

## 2017-08-10 NOTE — Progress Notes (Signed)
Hospital day # 1 pregnancy at 6238w6d--preterm contractions.  S:  Pt slept throughout night.  Awoke at 5 stated she felt contractions.  RN did not palpate any contractions.  None on monitor.  Pt denies urinary frequency or constipation issue.  Denies leaking fluid      Perception of contractions: none, regular, every 1 minutes      Vaginal bleeding: none now       Vaginal discharge:  Slight brown and no significant change  O: BP 122/73 (BP Location: Right Arm)   Pulse (!) 104 Comment: notified nurse  Temp 98.2 F (36.8 C) (Oral)   Resp 17   Ht 5\' 3"  (1.6 m)   Wt 68.4 kg (150 lb 12 oz)   LMP 11/23/2016   SpO2 100%   BMI 26.70 kg/m       Fetal tracings: Cat 1      Contractions:   occ       Uterus gravid and non-tender palpated with no contractions felt      Extremities: extremities normal, atraumatic, no cyanosis or edema and no significant edema and no signs of DVT          Labs:  No new labs       Meds: Procardia 10 mg  A: 4638w6d with abdominal pain     unchanged  P: Continue current plan of care      Upcoming tests/treatments:  Betamethasone at 1500 Reassurance given.  Offered SVE, pt declined.  Offered pain medication, pt declined.  Pt will try Procardia 10 mg PO.      MDs will follow  Kenney HousemanNancy Jean Jullie Arps CNM, MSN 08/10/2017 6:43 AM

## 2017-08-10 NOTE — Progress Notes (Signed)
   08/09/17 2350  Vital Signs  BP 122/73  BP Location Right Arm  Patient Position (if appropriate) Lying  BP Method Automatic  Pulse Rate (!) 104 (notified nurse)  Pulse Rate Source Dinamap  Resp 17  Temp 98.2 F (36.8 C)  Temp Source Oral  Oxygen Therapy  SpO2 100 %  Received patient from L&D awake, alert and oriented x 4. Pt denies headache or contraction at this time. LR infusing vi Lt FA PIV @ 50 mls/hr. Site "u". Pt settled comfortable in bed, oriented to room and call bell in close  reach.

## 2017-08-10 NOTE — Progress Notes (Signed)
Orders given to DC continuous EFM and return to [facility] NST with continuous TOCO.

## 2017-08-10 NOTE — Discharge Summary (Signed)
OB Discharge Summary     Patient Name: Amber Hancock DOB: 15-Mar-1991 MRN: [id number]  Date of admission: 08/09/2017  Date of discharge: 08/10/2017  Admitting diagnosis: 36 WKS NAUSEA VOMITING BAD HEADACHE SHOWING SIGNS OF HIGH BP Intrauterine pregnancy: [redacted]w[redacted]d     Secondary diagnosis:  Active Problems:   Preterm labor    Discharge diagnosis: Preterm contractions. Resolved headache. 35 week 3 day EGA pregnancy.                                                                     Hospital course:  Patient presented with complaints of a headache which resolved with medication use.  Preeclampsia labs were negative.  She was found to be contracting and received Procardia and IV fentanyl with resolution of contractions. Her cervix remained unchanged at 2cm dilation during her admission. She was deemed stable for discharge.  Her blood pressures were normal. Preterm labor precautions were discussed.   Physical exam  Vitals:   08/09/17 2239 08/09/17 2350 08/10/17 0800 08/10/17 1205  BP: 125/69 122/73 117/68 121/80  Pulse: (!) 103 (!) 104 (!) 105 95  Resp: 16 17 16 16   Temp:  98.2 F (36.8 C) 98.4 F (36.9 C) 98.1 F (36.7 C)  TempSrc:  Oral Oral Oral  SpO2:  100% 100% 98%  Weight:      Height:       General: alert, cooperative and no distress Uterine Fundus: Soft, gravid DVT Evaluation: No evidence of DVT seen on physical exam. Labs: Lab Results  Component Value Date   WBC 12.1 (H) 08/09/2017   HGB 12.2 08/09/2017   HCT 36.0 08/09/2017   MCV 85.5 08/09/2017   PLT 188 08/09/2017   CMP Latest Ref Rng & Units 08/09/2017  Glucose 65 - 99 mg/dL 77  BUN 6 - 20 mg/dL 7  Creatinine 4.13 - 2.44 mg/dL 0.10  Sodium 272 - 536 mmol/L 137  Potassium 3.5 - 5.1 mmol/L 3.5  Chloride 101 - 111 mmol/L 106  CO2 22 - 32 mmol/L 20(L)  Calcium 8.9 - 10.3 mg/dL 6.4(Q)  Total Protein 6.5 - 8.1 g/dL 6.1(L)  Total Bilirubin 0.3 - 1.2 mg/dL 0.4  Alkaline Phos 38 - 126 U/L 177(H)  AST 15 - 41 U/L  22  ALT 14 - 54 U/L 14    Discharge instruction: per After Visit Summary   After visit meds:  Allergies as of 08/10/2017      Reactions   Adhesive [tape] Rash, Hives   Reaction to adhesive bandage after surgery Reaction to adhesive bandage after surgery   Gabapentin Other (See Comments)   Increased anxiety.  Did things she doesn't remember doing. Other reaction(s): Other Increased anxiety.Did things she doesn't remember doing.   Onion Hives   Oxycodone-acetaminophen Nausea And Vomiting   Tylox [oxycodone-acetaminophen] Nausea And Vomiting   Vicodin [hydrocodone-acetaminophen] Nausea And Vomiting   Other Hives   Reaction to adhesive bandage after surgery      Medication List    STOP taking these medications   EPINEPHrine 0.3 mg/0.3 mL Soaj injection Commonly known as:  AUVI-Q   lamoTRIgine 25 MG tablet Commonly known as:  LAMICTAL     TAKE these medications   cyclobenzaprine 10 MG tablet Commonly  known as:  FLEXERIL Take 1 tablet (10 mg total) by mouth 3 (three) times daily as needed for muscle spasms.   docusate sodium 100 MG capsule Commonly known as:  COLACE Take 100 mg by mouth at bedtime.   doxylamine (Sleep) 25 MG tablet Commonly known as:  UNISOM Take 25 mg by mouth at bedtime.   folic acid 1 MG tablet Commonly known as:  FOLVITE Take 1 tablet (1 mg total) by mouth daily.   metoCLOPramide 10 MG tablet Commonly known as:  REGLAN Take 1 tablet (10 mg total) by mouth every 6 (six) hours as needed for nausea.   NIFEdipine 10 MG capsule Commonly known as:  PROCARDIA Take 1 capsule (10 mg total) by mouth every 6 (six) hours as needed (Contractions).   pantoprazole 40 MG tablet Commonly known as:  PROTONIX Take 40 mg by mouth at bedtime.   PRENATAL COMPLETE 14-0.4 MG Tabs Take 1 tablet by mouth daily.   promethazine 25 MG tablet Commonly known as:  PHENERGAN TAKE 1 TABLET (25 MG TOTAL) BY MOUTH EVERY 6 (SIX) HOURS AS NEEDED FOR NAUSEA OR VOMITING.    pyridoxine 100 MG tablet Commonly known as:  B-6 Take 100 mg by mouth at bedtime.       Diet: routine diet  Activity: Advance as tolerated. Pelvic rest for 6 weeks.   Outpatient follow WU:[id number]:Office appointment 08/11/2017 as scheduled.  Follow up Appt:No future appointments. Follow up Visit:No follow-ups on file.        Disposition: Home.    08/10/2017 Konrad FelixKULWA,Yulieth Carrender WAKURU, MD

## 2017-08-11 ENCOUNTER — Inpatient Hospital Stay (HOSPITAL_COMMUNITY): Payer: Medicaid Other

## 2017-08-11 ENCOUNTER — Inpatient Hospital Stay (HOSPITAL_COMMUNITY)
Admission: AD | Admit: 2017-08-11 | Discharge: 2017-08-11 | Disposition: A | Discharge status: Home / Self Care | Payer: Medicaid Other | Source: Ambulatory Visit | Attending: Obstetrics and Gynecology | Admitting: Obstetrics and Gynecology

## 2017-08-11 ENCOUNTER — Encounter (HOSPITAL_COMMUNITY): Payer: Self-pay | Admitting: *Deleted

## 2017-08-11 DIAGNOSIS — O9989 Other specified diseases and conditions complicating pregnancy, childbirth and the puerperium: Secondary | ICD-10-CM

## 2017-08-11 DIAGNOSIS — O26893 Other specified pregnancy related conditions, third trimester: Secondary | ICD-10-CM | POA: Diagnosis not present

## 2017-08-11 DIAGNOSIS — O289 Unspecified abnormal findings on antenatal screening of mother: Secondary | ICD-10-CM | POA: Insufficient documentation

## 2017-08-11 DIAGNOSIS — Z3A36 36 weeks gestation of pregnancy: Secondary | ICD-10-CM | POA: Insufficient documentation

## 2017-08-11 DIAGNOSIS — M549 Dorsalgia, unspecified: Secondary | ICD-10-CM | POA: Insufficient documentation

## 2017-08-11 LAB — GROUP B STREP BY PCR: Group B strep by PCR: NEGATIVE

## 2017-08-11 MED ORDER — LACTATED RINGERS IV BOLUS: 1000.0000 mL | Freq: Once | INTRAVENOUS | Status: DC

## 2017-08-11 MED ORDER — OXYCODONE-ACETAMINOPHEN 5-325 MG PO TABS
1.0000 | ORAL_TABLET | Freq: Once | ORAL | Status: AC
  Administered 2017-08-11: 1 via ORAL
  Filled 2017-08-11: qty 1

## 2017-08-11 MED ORDER — ONDANSETRON 4 MG PO TBDP
4.0000 mg | ORAL_TABLET | Freq: Once | ORAL | Status: AC | PRN
  Administered 2017-08-11: 4 mg via ORAL
  Filled 2017-08-11: qty 1

## 2017-08-11 NOTE — Discharge Instructions (Signed)
Back Pain in Pregnancy Back pain during pregnancy is common. Back pain may be caused by several factors that are related to changes during your pregnancy. Follow these instructions at home: Managing pain, stiffness, and swelling  If directed, apply ice for sudden (acute) back pain. ? Put ice in a plastic bag. ? Place a towel between your skin and the bag. ? Leave the ice on for 20 minutes, 2-3 times per day.  If directed, apply heat to the affected area before you exercise: ? Place a towel between your skin and the heat pack or heating pad. ? Leave the heat on for 20-30 minutes. ? Remove the heat if your skin turns bright red. This is especially important if you are unable to feel pain, heat, or cold. You may have a greater risk of getting burned. Activity  Exercise as told by your health care provider. Exercising is the best way to prevent or manage back pain.  Listen to your body when lifting. If lifting hurts, ask for help or bend your knees. This uses your leg muscles instead of your back muscles.  Squat down when picking up something from the floor. Do not bend over.  Only use bed rest as told by your health care provider. Bed rest should only be used for the most severe episodes of back pain. Standing, Sitting, and Lying Down  Do not stand in one place for long periods of time.  Use good posture when sitting. Make sure your head rests over your shoulders and is not hanging forward. Use a pillow on your lower back if necessary.  Try sleeping on your side, preferably the left side, with a pillow or two between your legs. If you are sore after a night's rest, your bed may be too soft. A firm mattress may provide more support for your back during pregnancy. General instructions  Do not wear high heels.  Eat a healthy diet. Try to gain weight within your health care provider's recommendations.  Use a maternity girdle, elastic sling, or back brace as told by your health care  provider.  Take over-the-counter and prescription medicines only as told by your health care provider.  Keep all follow-up visits as told by your health care provider. This is important. This includes any visits with any specialists, such as a physical therapist. Contact a health care provider if:  Your back pain interferes with your daily activities.  You have increasing pain in other parts of your body. Get help right away if:  You develop numbness, tingling, weakness, or problems with the use of your arms or legs.  You develop severe back pain that is not controlled with medicine.  You have a sudden change in bowel or bladder control.  You develop shortness of breath, dizziness, or you faint.  You develop nausea, vomiting, or sweating.  You have back pain that is a rhythmic, cramping pain similar to labor pains. Labor pain is usually 1-2 minutes apart, lasts for about 1 minute, and involves a bearing down feeling or pressure in your pelvis.  You have back pain and your water breaks or you have vaginal bleeding.  You have back pain or numbness that travels down your leg.  Your back pain developed after you fell.  You develop pain on one side of your back.  You see blood in your urine.  You develop skin blisters in the area of your back pain. This information is not intended to replace advice given to you   by your health care provider. Make sure you discuss any questions you have with your health care provider. Document Released: 08/06/2005 Document Revised: 10/04/2015 Document Reviewed: 01/10/2015 Elsevier Interactive Patient Education  2018 [employer]  

## 2017-08-11 NOTE — MAU Note (Signed)
Patient sent over from office c/o severe low back pain with contractions and loss of mucus plug.  Was just admitted over the weekend on L&D, but sent home.

## 2017-08-11 NOTE — MAU Provider Note (Signed)
  History     CSN: [id number]  Arrival date and time: 08/11/17 1209   First Provider Initiated Contact with Patient 08/11/17 1258      Chief Complaint  Patient presents with  . Back Pain  . Contractions   HPI PT SENT FROM THE HOSPITAL WITH CONTRACTIONS AND BACK PAIN  OB History    Gravida  1   Para      Term      Preterm      AB      Living        SAB      TAB      Ectopic      Multiple      Live Births              Past Medical History:  Diagnosis Date  . Anxiety   . Bilateral ovarian cysts   . Chronic pelvic pain in female   . Depression   . Fibromyalgia   . Headache   . Streak gonad    Left  . Vaginal Pap smear, abnormal     Past Surgical History:  Procedure Laterality Date  . LAPAROTOMY    . SALPINGOOPHORECTOMY      Family History  Problem Relation Age of Onset  . Hypertension Other        Malignant  . Diverticulitis Mother   . Hypertension Mother   . Cancer Paternal Grandmother   . Anesthesia problems Paternal Grandmother   . Post-traumatic stress disorder Father   . Eczema Father   . Mental illness Brother   . Depression Sister   . Allergic rhinitis Neg Hx   . Asthma Neg Hx   . Angioedema Neg Hx   . Urticaria Neg Hx     Social History   Tobacco Use  . Smoking status: Never Smoker  . Smokeless tobacco: Never Used  Substance Use Topics  . Alcohol use: Not Currently    Alcohol/week: 0.0 - 1.2 oz    Comment: occasional  . Drug use: No    Allergies:  Allergies  Allergen Reactions  . Adhesive [Tape] Rash and Hives    Reaction to adhesive bandage after surgery Reaction to adhesive bandage after surgery  . Gabapentin Other (See Comments)    Increased anxiety.  Did things she doesn't remember doing. Other reaction(s): Other Increased anxiety.Did things she doesn't remember doing.  . Onion Hives  . Oxycodone-Acetaminophen Nausea And Vomiting  . Tylox [Oxycodone-Acetaminophen] Nausea And Vomiting  . Vicodin  [Hydrocodone-Acetaminophen] Nausea And Vomiting  . Isopropyl Alcohol, Rubbing     Makes skin burn  . Other Hives    Reaction to adhesive bandage after surgery    No medications prior to admission.    Review of Systems Physical Exam   Blood pressure 125/80, pulse 92, temperature 99.2 F (37.3 C), temperature source Oral, resp. rate 18, weight 69.6 kg (153 lb 8 oz), last menstrual period 11/23/2016, SpO2 98 %.  Physical Exam  GEN ANXIOUS ABD ND SOFT NT CX DONE BY RN PER PT EXT NO CALF TENDERNESS B  MAU Course  Procedures  MDM   Assessment and Plan  NST WAS NR BUT BECAME REACTIVE WITH O2 AND POSITION CHANGE BPP 8/8 NO CERVICAL CHANGE SUPPORTIVE CARE GIVEN TO THE PT FOR BACK PAIN.  FU AS SCHEDULED  Anum Palecek A 08/11/2017, 6:24 PM

## 2017-08-11 NOTE — Progress Notes (Signed)
Offered patient a visit from social work per Dr Normand Sloopillard; pt denied wanting to speak with someone.

## 2017-08-11 NOTE — Progress Notes (Signed)
Patient tearful when RN discussed starting an IV.  Pt refusing at this time.  O2 given 10L/min via nonrebreather mask and patient turned onto right side.  Variability improved with interventions.  Dr. Normand Sloopillard aware that patient refusing IV at this time.   FHT improved with O2 and position change.

## 2017-08-11 NOTE — MAU Note (Signed)
Urine sent to lab 

## 2017-08-21 ENCOUNTER — Telehealth (HOSPITAL_COMMUNITY): Payer: Self-pay | Admitting: *Deleted

## 2017-08-21 ENCOUNTER — Encounter (HOSPITAL_COMMUNITY): Payer: Self-pay

## 2017-08-21 NOTE — Pre-Procedure Instructions (Signed)
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

## 2017-08-21 NOTE — Telephone Encounter (Signed)
Preadmission screen  

## 2017-08-28 NOTE — Patient Instructions (Signed)
Juluis Pitchriel V Stanco  08/28/2017   Your procedure is scheduled on:  09/01/2017  Enter through the [facility]tr[facility] of [facility] at 0930 AM.  Pick up the phone at the desk and dial [phone]  Call this number if you have problems the morning of surgery:[phone]  Remember:   Do not eat food:(After Midnight) Desps de medianoche.  Do not drink clear liquids: (After Midnight) Desps de medianoche.  Take these medicines the morning of surgery with A SIP OF WATER: none   Do not wear jewelry, make-up or nail polish.  Do not wear lotions, powders, or perfumes. Do not wear deodorant.  Do not shave 48 hours prior to surgery.  Do not bring valuables to the hospital.  [facility] is not   responsible for any belongings or valuables brought to the hospital.  Contacts, dentures or bridgework may not be worn into surgery.  Leave suitcase in the car. After surgery it may be brought to your room.  For patients admitted to the hospital, checkout time is 11:00 AM the day of              discharge.    N/A   Please read over the following fact sheets that you were given:   Surgical Site Infection Prevention

## 2017-08-31 ENCOUNTER — Encounter (HOSPITAL_COMMUNITY): Payer: Self-pay | Admitting: Anesthesiology

## 2017-08-31 ENCOUNTER — Encounter (HOSPITAL_COMMUNITY)
Admission: RE | Admit: 2017-08-31 | Discharge: 2017-08-31 | Disposition: A | Discharge status: Home / Self Care | Payer: Medicaid Other | Source: Ambulatory Visit | Attending: Obstetrics and Gynecology | Admitting: Obstetrics and Gynecology

## 2017-08-31 HISTORY — DX: Malignant hyperthermia due to anesthesia, initial encounter: T88.3XXA

## 2017-08-31 LAB — CBC
HCT: 35.5 % — ABNORMAL LOW (ref 36.0–46.0)
HEMOGLOBIN: 11.6 g/dL — AB (ref 12.0–15.0)
MCH: 28.2 pg (ref 26.0–34.0)
MCHC: 32.7 g/dL (ref 30.0–36.0)
MCV: 86.2 fL (ref 78.0–100.0)
Platelets: 160 10*3/uL (ref 150–400)
RBC: 4.12 MIL/uL (ref 3.87–5.11)
RDW: 14.7 % (ref 11.5–15.5)
WBC: 9.6 10*3/uL (ref 4.0–10.5)

## 2017-08-31 LAB — TYPE AND SCREEN
ABO/RH(D): AB POS
ANTIBODY SCREEN: NEGATIVE

## 2017-09-01 ENCOUNTER — Encounter (HOSPITAL_COMMUNITY): Payer: Self-pay | Admitting: *Deleted

## 2017-09-01 ENCOUNTER — Inpatient Hospital Stay (HOSPITAL_COMMUNITY): Payer: Medicaid Other | Admitting: Anesthesiology

## 2017-09-01 ENCOUNTER — Encounter (HOSPITAL_COMMUNITY)
Admission: RE | Disposition: A | Discharge status: Home / Self Care | Payer: Self-pay | Source: Ambulatory Visit | Attending: Obstetrics and Gynecology

## 2017-09-01 ENCOUNTER — Inpatient Hospital Stay (HOSPITAL_COMMUNITY)
Admission: RE | Admit: 2017-09-01 | Discharge: 2017-09-04 | DRG: 788 | Disposition: A | Discharge status: Home / Self Care | Payer: Medicaid Other | Source: Ambulatory Visit | Attending: Obstetrics and Gynecology | Admitting: Obstetrics and Gynecology

## 2017-09-01 DIAGNOSIS — M797 Fibromyalgia: Secondary | ICD-10-CM | POA: Diagnosis present

## 2017-09-01 DIAGNOSIS — N3289 Other specified disorders of bladder: Secondary | ICD-10-CM | POA: Diagnosis present

## 2017-09-01 DIAGNOSIS — O99344 Other mental disorders complicating childbirth: Secondary | ICD-10-CM | POA: Diagnosis present

## 2017-09-01 DIAGNOSIS — Z98891 History of uterine scar from previous surgery: Secondary | ICD-10-CM

## 2017-09-01 DIAGNOSIS — Z3A39 39 weeks gestation of pregnancy: Secondary | ICD-10-CM | POA: Diagnosis not present

## 2017-09-01 DIAGNOSIS — Z3490 Encounter for supervision of normal pregnancy, unspecified, unspecified trimester: Secondary | ICD-10-CM

## 2017-09-01 DIAGNOSIS — G894 Chronic pain syndrome: Secondary | ICD-10-CM | POA: Diagnosis present

## 2017-09-01 DIAGNOSIS — F419 Anxiety disorder, unspecified: Secondary | ICD-10-CM | POA: Diagnosis present

## 2017-09-01 DIAGNOSIS — O99354 Diseases of the nervous system complicating childbirth: Secondary | ICD-10-CM | POA: Diagnosis present

## 2017-09-01 LAB — PROTEIN / CREATININE RATIO, URINE
CREATININE, URINE: 289 mg/dL
Protein Creatinine Ratio: 0.08 mg/mg{Cre} (ref 0.00–0.15)
TOTAL PROTEIN, URINE: 24 mg/dL

## 2017-09-01 LAB — RPR: RPR Ser Ql: NONREACTIVE

## 2017-09-01 SURGERY — Surgical Case
Anesthesia: Spinal

## 2017-09-01 MED ORDER — SIMETHICONE 80 MG PO CHEW
80.0000 mg | CHEWABLE_TABLET | Freq: Three times a day (TID) | ORAL | Status: DC
  Administered 2017-09-01 – 2017-09-04 (×7): 80 mg via ORAL
  Filled 2017-09-01 (×8): qty 1

## 2017-09-01 MED ORDER — SIMETHICONE 80 MG PO CHEW
80.0000 mg | CHEWABLE_TABLET | ORAL | Status: DC
  Administered 2017-09-02 – 2017-09-04 (×3): 80 mg via ORAL
  Filled 2017-09-01 (×2): qty 1

## 2017-09-01 MED ORDER — ACETAMINOPHEN 500 MG PO TABS
1000.0000 mg | ORAL_TABLET | Freq: Four times a day (QID) | ORAL | Status: AC
  Administered 2017-09-01 – 2017-09-02 (×3): 1000 mg via ORAL
  Filled 2017-09-01 (×3): qty 2

## 2017-09-01 MED ORDER — PHENYLEPHRINE 40 MCG/ML (10ML) SYRINGE FOR IV PUSH (FOR BLOOD PRESSURE SUPPORT)
PREFILLED_SYRINGE | INTRAVENOUS | Status: AC
Start: 2017-09-01 — End: 2017-09-01
  Filled 2017-09-01: qty 10

## 2017-09-01 MED ORDER — NALBUPHINE HCL 10 MG/ML IJ SOLN: 5.0000 mg | Freq: Once | INTRAMUSCULAR | Status: DC | PRN

## 2017-09-01 MED ORDER — NALBUPHINE HCL 10 MG/ML IJ SOLN: 5.0000 mg | INTRAMUSCULAR | Status: DC | PRN

## 2017-09-01 MED ORDER — PHENYLEPHRINE 8 MG IN D5W 100 ML (0.08MG/ML) PREMIX OPTIME
INJECTION | INTRAVENOUS | Status: DC | PRN
  Administered 2017-09-01: 20 ug/min via INTRAVENOUS

## 2017-09-01 MED ORDER — METOCLOPRAMIDE HCL 5 MG/ML IJ SOLN
INTRAMUSCULAR | Status: DC | PRN
  Administered 2017-09-01: 10 mg via INTRAVENOUS

## 2017-09-01 MED ORDER — DIBUCAINE 1 % RE OINT: 1.0000 "application " | TOPICAL_OINTMENT | RECTAL | Status: DC | PRN

## 2017-09-01 MED ORDER — OXYTOCIN 10 UNIT/ML IJ SOLN
INTRAMUSCULAR | Status: AC
  Filled 2017-09-01: qty 4

## 2017-09-01 MED ORDER — IBUPROFEN 600 MG PO TABS
600.0000 mg | ORAL_TABLET | Freq: Four times a day (QID) | ORAL | Status: DC
  Administered 2017-09-01 – 2017-09-02 (×5): 600 mg via ORAL
  Filled 2017-09-01 (×5): qty 1

## 2017-09-01 MED ORDER — SODIUM CHLORIDE 0.9 % IR SOLN
Status: DC | PRN
  Administered 2017-09-01: 1

## 2017-09-01 MED ORDER — SOD CITRATE-CITRIC ACID 500-334 MG/5ML PO SOLN
30.0000 mL | Freq: Once | ORAL | Status: AC
  Administered 2017-09-01: 30 mL via ORAL
  Filled 2017-09-01: qty 15

## 2017-09-01 MED ORDER — FENTANYL CITRATE (PF) 100 MCG/2ML IJ SOLN
INTRAMUSCULAR | Status: AC
  Filled 2017-09-01: qty 2

## 2017-09-01 MED ORDER — DIPHENHYDRAMINE HCL 25 MG PO CAPS
25.0000 mg | ORAL_CAPSULE | ORAL | Status: DC | PRN
  Administered 2017-09-02: 25 mg via ORAL
  Filled 2017-09-01 (×2): qty 1

## 2017-09-01 MED ORDER — BUPIVACAINE IN DEXTROSE 0.75-8.25 % IT SOLN
INTRATHECAL | Status: DC | PRN
  Administered 2017-09-01: 1.4 mL via INTRATHECAL

## 2017-09-01 MED ORDER — LACTATED RINGERS IV SOLN
INTRAVENOUS | Status: DC
  Administered 2017-09-01 – 2017-09-02 (×2): via INTRAVENOUS

## 2017-09-01 MED ORDER — ONDANSETRON HCL 4 MG/2ML IJ SOLN
INTRAMUSCULAR | Status: DC | PRN
  Administered 2017-09-01: 4 mg via INTRAVENOUS

## 2017-09-01 MED ORDER — FENTANYL CITRATE (PF) 100 MCG/2ML IJ SOLN
INTRAMUSCULAR | Status: DC | PRN
  Administered 2017-09-01: 10 ug via INTRATHECAL

## 2017-09-01 MED ORDER — MEPERIDINE HCL 25 MG/ML IJ SOLN: 6.2500 mg | INTRAMUSCULAR | Status: DC | PRN

## 2017-09-01 MED ORDER — FENTANYL CITRATE (PF) 100 MCG/2ML IJ SOLN: 25.0000 ug | INTRAMUSCULAR | Status: DC | PRN

## 2017-09-01 MED ORDER — LACTATED RINGERS IV SOLN
INTRAVENOUS | Status: DC
  Administered 2017-09-01: 11:00:00 via INTRAVENOUS

## 2017-09-01 MED ORDER — MORPHINE SULFATE (PF) 0.5 MG/ML IJ SOLN
INTRAMUSCULAR | Status: DC | PRN
  Administered 2017-09-01: .2 ug via INTRATHECAL

## 2017-09-01 MED ORDER — HYDROMORPHONE HCL 2 MG PO TABS
2.0000 mg | ORAL_TABLET | ORAL | Status: DC | PRN
  Administered 2017-09-02 (×3): 4 mg via ORAL
  Administered 2017-09-02: 2 mg via ORAL
  Administered 2017-09-03 (×3): 4 mg via ORAL
  Filled 2017-09-01 (×6): qty 2
  Filled 2017-09-01: qty 1

## 2017-09-01 MED ORDER — COCONUT OIL OIL: 1.0000 "application " | TOPICAL_OIL | Status: DC | PRN

## 2017-09-01 MED ORDER — KETOROLAC TROMETHAMINE 30 MG/ML IJ SOLN: 30.0000 mg | Freq: Four times a day (QID) | INTRAMUSCULAR | Status: AC | PRN

## 2017-09-01 MED ORDER — OXYTOCIN 40 UNITS IN LACTATED RINGERS INFUSION - SIMPLE MED: 2.5000 [IU]/h | INTRAVENOUS | Status: AC

## 2017-09-01 MED ORDER — SIMETHICONE 80 MG PO CHEW: 80.0000 mg | CHEWABLE_TABLET | ORAL | Status: DC | PRN

## 2017-09-01 MED ORDER — CEFAZOLIN SODIUM-DEXTROSE 2-4 GM/100ML-% IV SOLN
2.0000 g | INTRAVENOUS | Status: AC
  Administered 2017-09-01: 2 g via INTRAVENOUS
  Filled 2017-09-01: qty 100

## 2017-09-01 MED ORDER — SODIUM CHLORIDE 0.9% FLUSH: 3.0000 mL | INTRAVENOUS | Status: DC | PRN

## 2017-09-01 MED ORDER — LACTATED RINGERS IV SOLN: INTRAVENOUS | Status: DC

## 2017-09-01 MED ORDER — SCOPOLAMINE 1 MG/3DAYS TD PT72
1.0000 | MEDICATED_PATCH | Freq: Once | TRANSDERMAL | Status: DC
  Administered 2017-09-01: 1.5 mg via TRANSDERMAL
  Filled 2017-09-01: qty 1

## 2017-09-01 MED ORDER — TETANUS-DIPHTH-ACELL PERTUSSIS 5-2.5-18.5 LF-MCG/0.5 IM SUSP: 0.5000 mL | Freq: Once | INTRAMUSCULAR | Status: DC

## 2017-09-01 MED ORDER — OXYTOCIN 10 UNIT/ML IJ SOLN
INTRAMUSCULAR | Status: DC | PRN
  Administered 2017-09-01: 40 [IU] via INTRAVENOUS

## 2017-09-01 MED ORDER — SCOPOLAMINE 1 MG/3DAYS TD PT72: 1.0000 | MEDICATED_PATCH | Freq: Once | TRANSDERMAL | Status: DC

## 2017-09-01 MED ORDER — KETOROLAC TROMETHAMINE 30 MG/ML IJ SOLN
INTRAMUSCULAR | Status: AC
  Administered 2017-09-01: 30 mg via INTRAMUSCULAR
  Filled 2017-09-01: qty 1

## 2017-09-01 MED ORDER — LACTATED RINGERS IV SOLN
INTRAVENOUS | Status: DC | PRN
  Administered 2017-09-01: 12:00:00 via INTRAVENOUS

## 2017-09-01 MED ORDER — ONDANSETRON HCL 4 MG/2ML IJ SOLN: 4.0000 mg | Freq: Three times a day (TID) | INTRAMUSCULAR | Status: DC | PRN

## 2017-09-01 MED ORDER — DEXAMETHASONE SODIUM PHOSPHATE 4 MG/ML IJ SOLN
INTRAMUSCULAR | Status: AC
  Filled 2017-09-01: qty 1

## 2017-09-01 MED ORDER — BUPIVACAINE IN DEXTROSE 0.75-8.25 % IT SOLN
INTRATHECAL | Status: AC
  Filled 2017-09-01: qty 2

## 2017-09-01 MED ORDER — DIPHENHYDRAMINE HCL 50 MG/ML IJ SOLN: 12.5000 mg | INTRAMUSCULAR | Status: DC | PRN

## 2017-09-01 MED ORDER — NALOXONE HCL 4 MG/10ML IJ SOLN
1.0000 ug/kg/h | INTRAVENOUS | Status: DC | PRN
  Filled 2017-09-01: qty 5

## 2017-09-01 MED ORDER — ONDANSETRON HCL 4 MG/2ML IJ SOLN
INTRAMUSCULAR | Status: AC
Start: 2017-09-01 — End: 2017-09-01
  Filled 2017-09-01: qty 2

## 2017-09-01 MED ORDER — NALOXONE HCL 0.4 MG/ML IJ SOLN: 0.4000 mg | INTRAMUSCULAR | Status: DC | PRN

## 2017-09-01 MED ORDER — MORPHINE SULFATE (PF) 0.5 MG/ML IJ SOLN
INTRAMUSCULAR | Status: AC
  Filled 2017-09-01: qty 10

## 2017-09-01 MED ORDER — WITCH HAZEL-GLYCERIN EX PADS: 1.0000 "application " | MEDICATED_PAD | CUTANEOUS | Status: DC | PRN

## 2017-09-01 MED ORDER — METOCLOPRAMIDE HCL 5 MG/ML IJ SOLN
INTRAMUSCULAR | Status: AC
  Filled 2017-09-01: qty 2

## 2017-09-01 MED ORDER — PHENYLEPHRINE 8 MG IN D5W 100 ML (0.08MG/ML) PREMIX OPTIME
INJECTION | INTRAVENOUS | Status: AC
  Filled 2017-09-01: qty 100

## 2017-09-01 MED ORDER — METOCLOPRAMIDE HCL 5 MG/ML IJ SOLN: 10.0000 mg | Freq: Once | INTRAMUSCULAR | Status: DC | PRN

## 2017-09-01 MED ORDER — DEXAMETHASONE SODIUM PHOSPHATE 4 MG/ML IJ SOLN
INTRAMUSCULAR | Status: DC | PRN
  Administered 2017-09-01: 4 mg via INTRAVENOUS

## 2017-09-01 SURGICAL SUPPLY — 36 items
APL SKNCLS STERI-STRIP NONHPOA (GAUZE/BANDAGES/DRESSINGS) ×1
BENZOIN TINCTURE PRP APPL 2/3 (GAUZE/BANDAGES/DRESSINGS) ×3 IMPLANT
CHLORAPREP W/TINT 26ML (MISCELLANEOUS) ×3 IMPLANT
CLAMP CORD UMBIL (MISCELLANEOUS) IMPLANT
CLOSURE STERI STRIP 1/2 X4 (GAUZE/BANDAGES/DRESSINGS) ×2 IMPLANT
CLOTH BEACON ORANGE TIMEOUT ST (SAFETY) ×3 IMPLANT
DRSG OPSITE POSTOP 4X10 (GAUZE/BANDAGES/DRESSINGS) ×3 IMPLANT
ELECT REM PT RETURN 9FT ADLT (ELECTROSURGICAL) ×3
ELECTRODE REM PT RTRN 9FT ADLT (ELECTROSURGICAL) ×1 IMPLANT
EXTRACTOR VACUUM M CUP 4 TUBE (SUCTIONS) IMPLANT
EXTRACTOR VACUUM M CUP 4' TUBE (SUCTIONS)
GLOVE BIO SURGEON STRL SZ7.5 (GLOVE) ×3 IMPLANT
GLOVE BIOGEL PI IND STRL 7.0 (GLOVE) ×1 IMPLANT
GLOVE BIOGEL PI IND STRL 7.5 (GLOVE) ×1 IMPLANT
GLOVE BIOGEL PI INDICATOR 7.0 (GLOVE) ×2
GLOVE BIOGEL PI INDICATOR 7.5 (GLOVE) ×2
GOWN STRL REUS W/TWL LRG LVL3 (GOWN DISPOSABLE) ×6 IMPLANT
KIT ABG SYR 3ML LUER SLIP (SYRINGE) IMPLANT
NDL HYPO 25X5/8 SAFETYGLIDE (NEEDLE) IMPLANT
NEEDLE HYPO 25X5/8 SAFETYGLIDE (NEEDLE) IMPLANT
NS IRRIG 1000ML POUR BTL (IV SOLUTION) ×3 IMPLANT
PACK C SECTION WH (CUSTOM PROCEDURE TRAY) ×3 IMPLANT
PAD OB MATERNITY 4.3X12.25 (PERSONAL CARE ITEMS) ×3 IMPLANT
PENCIL SMOKE EVAC W/HOLSTER (ELECTROSURGICAL) ×3 IMPLANT
RTRCTR C-SECT PINK 25CM LRG (MISCELLANEOUS) ×3 IMPLANT
STRIP CLOSURE SKIN 1/2X4 (GAUZE/BANDAGES/DRESSINGS) ×2 IMPLANT
SUT CHROMIC 2 0 CT 1 (SUTURE) ×3 IMPLANT
SUT MNCRL AB 3-0 PS2 27 (SUTURE) ×3 IMPLANT
SUT PLAIN 2 0 XLH (SUTURE) ×3 IMPLANT
SUT VIC AB 0 CT1 36 (SUTURE) ×3 IMPLANT
SUT VIC AB 0 CTX 36 (SUTURE) ×9
SUT VIC AB 0 CTX36XBRD ANBCTRL (SUTURE) ×3 IMPLANT
SUT VIC AB 2-0 SH 27 (SUTURE) ×6
SUT VIC AB 2-0 SH 27XBRD (SUTURE) ×2 IMPLANT
TOWEL OR 17X24 6PK STRL BLUE (TOWEL DISPOSABLE) ×3 IMPLANT
TRAY FOLEY W/BAG SLVR 14FR LF (SET/KITS/TRAYS/PACK) ×3 IMPLANT

## 2017-09-01 NOTE — H&P (Signed)
Amber Hancock is a 27 y.o. female presenting for primary c-section per her request d/t chronic pain syndrome and fibromyalgia.  She felt she would do much better with this delivery method.  OB History    Gravida  1   Para  0   Term  0   Preterm  0   AB  0   Living  0     SAB  0   TAB  0   Ectopic  0   Multiple  0   Live Births  0          Past Medical History:  Diagnosis Date  . Anxiety   . Bilateral ovarian cysts   . Chronic pelvic pain in female   . Depression   . Fibromyalgia    chronic myofacial pain  . Headache   . Malignant hyperthermia    MGM  . Streak gonad    Left  . Vaginal Pap smear, abnormal    Past Surgical History:  Procedure Laterality Date  . LAPAROTOMY    . SALPINGOOPHORECTOMY     Family History: family history includes Anesthesia problems in her paternal grandmother; Cancer in her paternal grandmother; Depression in her sister; Diverticulitis in her mother; Eczema in her father; Heart failure in her maternal grandfather; Hypertension in her mother; Mental illness in her brother; Post-traumatic stress disorder in her father. Social History:  reports that she has never smoked. She has never used smokeless tobacco. She reports that she drank alcohol. She reports that she does not use drugs.     Maternal Diabetes: No Genetic Screening: Normal Maternal Ultrasounds/Referrals: Normal Fetal Ultrasounds or other Referrals:  None Maternal Substance Abuse:  No Significant Maternal Medications:  None Significant Maternal Lab Results:  Lab values include: Group B Strep negative Other Comments:  None  ROS  Non-contributory  History   Blood pressure 133/89, pulse 98, temperature 98.4 F (36.9 C), temperature source Oral, resp. rate 20, height 5\' 3"  (1.6 m), weight 155 lb (70.3 kg), last menstrual period 11/23/2016. Exam Physical Exam  Lungs CTA CV RRR Abd gravid, NT Ext no calf tenderness FHT 140s  Prenatal labs: ABO, Rh: --/--/AB  POS (04/22 1050) Antibody: NEG (04/22 1050) Rubella: Immune (10/03 0000) RPR: Non Reactive (04/22 1106)  HBsAg: Negative (10/03 0000)  HIV: Non-reactive (10/03 0000)  GBS:   Negative  Assessment/Plan: P0 at 39wks being admitted for primary c-section.  R/B/A reviewed including but not limited to bleeding, infection and injury.  Questions answered.  Consent s/w.   Amber Hancock 09/01/2017, 11:13 AM

## 2017-09-01 NOTE — Anesthesia Preprocedure Evaluation (Signed)
Anesthesia Evaluation  Patient identified by MRN, date of birth, ID band Patient awake    Reviewed: Allergy & Precautions, NPO status , Patient's Chart, lab work & pertinent test results  History of Anesthesia Complications (+) MALIGNANT HYPERTHERMIAHistory of anesthetic complications: grandmother has malignant hyperthermia.  Airway Mallampati: II  TM Distance: >3 FB Neck ROM: Full    Dental no notable dental hx.    Pulmonary neg pulmonary ROS,    Pulmonary exam normal breath sounds clear to auscultation       Cardiovascular negative cardio ROS Normal cardiovascular exam Rhythm:Regular Rate:Normal     Neuro/Psych negative neurological ROS  negative psych ROS   GI/Hepatic negative GI ROS, Neg liver ROS,   Endo/Other  negative endocrine ROS  Renal/GU negative Renal ROS  negative genitourinary   Musculoskeletal  (+) Fibromyalgia -  Abdominal   Peds negative pediatric ROS (+)  Hematology negative hematology ROS (+)   Anesthesia Other Findings   Reproductive/Obstetrics (+) Pregnancy                             Anesthesia Physical Anesthesia Plan  ASA: II  Anesthesia Plan: Spinal   Post-op Pain Management:    Induction:   PONV Risk Score and Plan: 2 and Ondansetron and Treatment may vary due to age or medical condition  Airway Management Planned: Natural Airway  Additional Equipment:   Intra-op Plan:   Post-operative Plan:   Informed Consent: I have reviewed the patients History and Physical, chart, labs and discussed the procedure including the risks, benefits and alternatives for the proposed anesthesia with the patient or authorized representative who has indicated his/her understanding and acceptance.   Dental advisory given  Plan Discussed with:   Anesthesia Plan Comments: (Non-triggering anesthetics only)        Anesthesia Quick Evaluation

## 2017-09-01 NOTE — Lactation Note (Signed)
This note was copied from a baby's chart. Lactation Consultation Note  Patient Name: Girl Yehuda Savannahriel [place] Today's Date: 09/01/2017   P1, Baby 5 hours old.  Mother has hx of left salpingo-oophorectomy. She is concerned about her future milk supply. Discussed ways to be proactive about her milk supply such as hand expression, frequent feedings and pumping if needed. Mother states she has nipple piercing that she wants to put earrings back in between feedings. Discussed that may be difficult when baby is cluster feeding. Baby has had one 20 min feeding and mother states she feels comfortable. Offered to review hand expression but mother states her RN has taught her. Offered to help w/ latching but mother states she will wait for feeding cues. Mom encouraged to feed baby 8-12 times/24 hours and with feeding cues.  Encouraged her to place baby STS within the next hour and spoon feed if she does not wake to feed. Mom made aware of O/P services, breastfeeding support groups, community resources, and our phone # for post-discharge questions.          Maternal Data    Feeding    LATCH Score                   Interventions    Lactation Tools Discussed/Used     Consult Status      Hardie PulleyBerkelhammer, Ketzaly Cardella Boschen 09/01/2017, 5:53 PM

## 2017-09-01 NOTE — Op Note (Signed)
Cesarean Section Procedure Note  Indications: P0 at 39wks admitted for primary c-section with a h/o chronic pain syndrome, fibromyalgia and multiple allergies.  Pre-operative Diagnosis: 1.Elective Cesarean Section 2.h/o Chronic Pain Syndrome 3.Fibromyalgia   Post-operative Diagnosis: 1.Elective Cesarean Section 2.h/o Chronic Pain Syndrome 3.Fibromyalgia  Procedure: PRIMARY LOW TRANSVERSE CESAREAN SECTION  Surgeon: Osborn Cohooberts, Sedra Morfin, MD    Assistants: Lanice ShirtsScrub Tech, Cline CrockShelton Rascoe  Anesthesia: Regional  Anesthesiologist: Phillips Groutarignan, Peter, MD   Procedure Details  The patient was taken to the operating room secondary to elective c-section with h/o CPS and fibromyalgia after the risks, benefits, complications, treatment options, and expected outcomes were discussed with the patient.  The patient concurred with the proposed plan, giving informed consent which was signed and witnessed. The patient was taken to Operating Room Two, identified as Amber Hancock and the procedure verified as C-Section Delivery. A Time Out was held and the above information confirmed.  After induction of anesthesia by obtaining a spinal, the patient was prepped and draped in the usual sterile manner. A Pfannenstiel skin incision was made and carried down through the subcutaneous tissue to the underlying layer of fascia.  The fascia was incised bilaterally and extended transversely bilaterally with the Mayo scissors. Kocher clamps were placed on the inferior aspect of the fascial incision and the underlying rectus muscle was separated from the fascia. The same was done on the superior aspect of the fascial incision.  The peritoneum was identified, entered bluntly and extended manually.  An Alexis self-retaining retractor was placed.  The utero-vesical peritoneal reflection was incised transversely and the bladder flap was bluntly freed from the lower uterine segment. A low transverse uterine incision was made with the scalpel  and extended bilaterally with the bandage scissors.  The infant was delivered in vertex position without difficulty.  After the umbilical cord was clamped and cut, the infant was handed to the awaiting pediatricians.  Cord blood was obtained for evaluation.  The placenta was removed intact and appeared to be within normal limits. The uterus was cleared of all clots and debris. The uterine incision was closed with running interlocking sutures of 0 Vicryl and a second imbricating layer was performed as well.   Right ovary and fallopian tube appeared to be within normal limits.  Good hemostasis was noted.  Copious irrigation was performed until clear.  The peritoneum was repaired with 2-0 chromic via a running suture.  The fascia was reapproximated with a running suture of 0 Vicryl. The subcutaneous tissue was reapproximated with 3 interrupted sutures of 2-0 plain.  The skin was reapproximated with a subcuticular suture of 3-0 monocryl.  Steristrips were applied.  Instrument, sponge, and needle counts were correct prior to abdominal closure and at the conclusion of the case.  The patient was awaiting transfer to the recovery room in good condition.  Findings: Live female infant with Apgars 8 at one minute and 9 at five minutes.  Normal appearing right ovary and fallopian tube and absent left ovary and tube.  Estimated Blood Loss:  217 ml         Drains: foley to gravity 200 cc         Total IV Fluids: 2300 ml         Specimens to Pathology: Placenta         Complications:  None; patient tolerated the procedure well.         Disposition: PACU - hemodynamically stable.         Condition: stable  Attending Attestation: I performed the procedure.

## 2017-09-01 NOTE — Transfer of Care (Signed)
Immediate Anesthesia Transfer of Care Note  Patient: Amber PitchAriel V Hancock  Procedure(s) Performed: PRIMARY CESAREAN SECTION (N/A )  Patient Location: PACU  Anesthesia Type:Spinal  Level of Consciousness: awake, alert  and oriented  Airway & Oxygen Therapy: Patient Spontanous Breathing  Post-op Assessment: Report given to RN and Post -op Vital signs reviewed and stable  Post vital signs: Reviewed and stable  Last Vitals:  Vitals Value Taken Time  BP 129/77 09/01/2017  1:12 PM  Temp 36.6 C 09/01/2017  1:07 PM  Pulse 72 09/01/2017  1:15 PM  Resp 16 09/01/2017  1:15 PM  SpO2 99 % 09/01/2017  1:15 PM  Vitals shown include unvalidated device data.  Last Pain:  Vitals:   09/01/17 1307  TempSrc:   PainSc: (P) 0-No pain         Complications: No apparent anesthesia complications

## 2017-09-01 NOTE — Anesthesia Procedure Notes (Signed)
Spinal  Patient location during procedure: OR Staffing Anesthesiologist: Cecilie Heidel, MD Performed: anesthesiologist  Preanesthetic Checklist Completed: patient identified, site marked, surgical consent, pre-op evaluation, timeout performed, IV checked, risks and benefits discussed and monitors and equipment checked Spinal Block Patient position: sitting Prep: DuraPrep Patient monitoring: heart rate, continuous pulse ox and blood pressure Approach: right paramedian Location: L3-4 Injection technique: single-shot Needle Needle type: Sprotte  Needle gauge: 24 G Needle length: 9 cm Additional Notes Expiration date of kit checked and confirmed. Patient tolerated procedure well, without complications.       

## 2017-09-02 ENCOUNTER — Encounter (HOSPITAL_COMMUNITY): Payer: Self-pay | Admitting: Obstetrics and Gynecology

## 2017-09-02 LAB — CBC
HCT: 29.4 % — ABNORMAL LOW (ref 36.0–46.0)
HEMOGLOBIN: 9.7 g/dL — AB (ref 12.0–15.0)
MCH: 28.6 pg (ref 26.0–34.0)
MCHC: 33 g/dL (ref 30.0–36.0)
MCV: 86.7 fL (ref 78.0–100.0)
Platelets: 151 10*3/uL (ref 150–400)
RBC: 3.39 MIL/uL — AB (ref 3.87–5.11)
RDW: 15 % (ref 11.5–15.5)
WBC: 14.1 10*3/uL — ABNORMAL HIGH (ref 4.0–10.5)

## 2017-09-02 LAB — URIC ACID: Uric Acid, Serum: 4.7 mg/dL (ref 2.3–6.6)

## 2017-09-02 LAB — COMPREHENSIVE METABOLIC PANEL
ALK PHOS: 139 U/L — AB (ref 38–126)
ALT: 12 U/L — AB (ref 14–54)
AST: 22 U/L (ref 15–41)
Albumin: 2.3 g/dL — ABNORMAL LOW (ref 3.5–5.0)
Anion gap: 5 (ref 5–15)
BUN: 11 mg/dL (ref 6–20)
CALCIUM: 8.1 mg/dL — AB (ref 8.9–10.3)
CO2: 26 mmol/L (ref 22–32)
CREATININE: 0.76 mg/dL (ref 0.44–1.00)
Chloride: 102 mmol/L (ref 101–111)
GFR calc non Af Amer: >60 mL/min (ref >60)
Glucose, Bld: 91 mg/dL (ref 65–99)
Potassium: 3.9 mmol/L (ref 3.5–5.1)
SODIUM: 133 mmol/L — AB (ref 135–145)
Total Bilirubin: 0.5 mg/dL (ref 0.3–1.2)
Total Protein: 5.3 g/dL — ABNORMAL LOW (ref 6.5–8.1)

## 2017-09-02 MED ORDER — KETOROLAC TROMETHAMINE 30 MG/ML IJ SOLN
30.0000 mg | Freq: Once | INTRAMUSCULAR | Status: AC
  Administered 2017-09-02: 30 mg via INTRAVENOUS
  Filled 2017-09-02: qty 1

## 2017-09-02 MED ORDER — CYCLOBENZAPRINE HCL 10 MG PO TABS
10.0000 mg | ORAL_TABLET | Freq: Three times a day (TID) | ORAL | Status: DC | PRN
  Administered 2017-09-02 – 2017-09-03 (×2): 10 mg via ORAL
  Filled 2017-09-02 (×3): qty 1

## 2017-09-02 MED ORDER — DOCUSATE SODIUM 100 MG PO CAPS
100.0000 mg | ORAL_CAPSULE | Freq: Two times a day (BID) | ORAL | Status: DC | PRN
Start: 2017-09-02 — End: 2017-09-04
  Administered 2017-09-02 – 2017-09-03 (×3): 100 mg via ORAL
  Filled 2017-09-02 (×3): qty 1

## 2017-09-02 MED ORDER — ONDANSETRON HCL 4 MG PO TABS
8.0000 mg | ORAL_TABLET | Freq: Once | ORAL | Status: AC
  Administered 2017-09-02: 8 mg via ORAL
  Filled 2017-09-02: qty 2

## 2017-09-02 MED ORDER — FLUOXETINE HCL 20 MG PO CAPS
20.0000 mg | ORAL_CAPSULE | Freq: Every day | ORAL | Status: DC
  Administered 2017-09-02 – 2017-09-04 (×3): 20 mg via ORAL
  Filled 2017-09-02 (×3): qty 1

## 2017-09-02 NOTE — Progress Notes (Signed)
Post Partum Day 1 Primary C/S Subjective: Pt states pain is uncontrolled with Dilaudid; States her blood pressure is up r/t pain. Pt has chronic pain history  Objective: Blood pressure (!) 149/100, pulse 70, temperature 98.1 F (36.7 C), temperature source Oral, resp. rate (!) 70, height 5\' 3"  (1.6 m), weight 155 lb (70.3 kg), last menstrual period 11/23/2016, SpO2 99 %, unknown if currently breastfeeding.  Physical Exam:  General: alert, cooperative and appears stated age Lochia: appropriate Uterine Fundus: firm Incision: CDI   Recent Labs    08/31/17 1106 09/02/17 0509  HGB 11.6* 9.7*  HCT 35.5* 29.4*    Assessment/Plan: D/C ibuprofen until 600am Give One dose Toradol 30mg  IVP now Flexeril 10mg  PO now Zofran 8mg  PO now   LOS: 1 day   Lori A Clemmons CNM 09/02/2017, 10:17 PM

## 2017-09-02 NOTE — Progress Notes (Signed)
MOB was referred for history of depression/anxiety. * Referral screened out by Clinical Social Worker because none of the following criteria appear to apply: ~ History of anxiety/depression during this pregnancy, or of post-partum depression. ~ Diagnosis of anxiety and/or depression within last 3 years (2013) OR * MOB's symptoms currently being treated with medication and/or therapy. Please contact the Clinical Social Worker if needs arise, by MOB request, or if MOB scores greater than 9/yes to question 10 on Edinburgh Postpartum Depression Screen.  MOB restarted Prozac, which she was taking prior to pregnancy.  CSW notes that she has had counseling in the past as well. 

## 2017-09-02 NOTE — Progress Notes (Signed)
Amber Hancock [id number]  Subjective: Postpartum Day 1: Primary C/S due to patient choice-- history of chronic pain, fibromyalgia, and multiple allergies. Foley removed this am, patient has spontaneously voided. Taking dilaudid for pain due to intolerance of NSAIDs. Eating and drinking well. Patient has history of anxiety and requested to restart Prozac today, had discussed with [facility] providers.  Patient up ad lib, reports no syncope or dizziness. Feeding:  Breastfeeding Contraceptive plan:  Progesterone only pill   Objective:  Vitals:   09/01/17 2220 09/01/17 2345 09/02/17 0200 09/02/17 0435  BP:  112/73  117/84  Pulse:  64  75  Resp:  18  18  Temp:  98.3 F (36.8 C)  98.1 F (36.7 C)  TempSrc:  Oral  Oral  SpO2: 98% 96% 98% 99%  Weight:      Height:        CBC Latest Ref Rng & Units 09/02/2017 08/31/2017 08/09/2017  WBC 4.0 - 10.5 K/uL 14.1(H) 9.6 12.1(H)  Hemoglobin 12.0 - 15.0 g/dL 8.4(X9.7(L) 11.6(L) 12.2  Hematocrit 36.0 - 46.0 % 29.4(L) 35.5(L) 36.0  Platelets 150 - 400 K/uL 151 160 188     Physical Exam:  General: alert and cooperative Lochia: appropriate Uterine Fundus: firm Abdomen: appropriately tender  Incision: Honeycomb dressing dry and intact, small serosanguinous drainage DVT Evaluation: No evidence of DVT seen on physical exam. Negative Homan's sign. No cords or calf tenderness. No significant calf/ankle edema.  Assessment/Plan: Status post cesarean delivery, day 1. Stable Continue current care. Restart Prozac 20mg  QD.  Social work consult    Nigel BridgemanVicki Concettina Leth MSN, CNM 09/02/2017, 8:06 AM

## 2017-09-02 NOTE — Lactation Note (Signed)
This note was copied from a baby's chart. Lactation Consultation Note  Patient Name: Amber Hancock Today's Date: 09/02/2017  Mom states baby has a stuffy nose and not latching as well.  Baby is currently sleeping skin to skin.  Instructed to watch for cues and call for assist prn.  Mom asking questions about breast pumps.  Discussed [employer] loaner.   Maternal Data    Feeding    LATCH Score                   Interventions    Lactation Tools Discussed/Used     Consult Status      Huston FoleyMOULDEN, Kaeleen Odom S 09/02/2017, 1:53 PM

## 2017-09-03 LAB — COMPREHENSIVE METABOLIC PANEL
ALT: 13 U/L — ABNORMAL LOW (ref 14–54)
AST: 22 U/L (ref 15–41)
Albumin: 2.6 g/dL — ABNORMAL LOW (ref 3.5–5.0)
Alkaline Phosphatase: 134 U/L — ABNORMAL HIGH (ref 38–126)
Anion gap: 9 (ref 5–15)
BUN: 8 mg/dL (ref 6–20)
CO2: 25 mmol/L (ref 22–32)
Calcium: 8.4 mg/dL — ABNORMAL LOW (ref 8.9–10.3)
Chloride: 104 mmol/L (ref 101–111)
Creatinine, Ser: 0.87 mg/dL (ref 0.44–1.00)
GFR calc Af Amer: >60 mL/min (ref >60)
GFR calc non Af Amer: >60 mL/min (ref >60)
Glucose, Bld: 85 mg/dL (ref 65–99)
Potassium: 3.8 mmol/L (ref 3.5–5.1)
Sodium: 138 mmol/L (ref 135–145)
Total Bilirubin: 0.1 mg/dL — ABNORMAL LOW (ref 0.3–1.2)
Total Protein: 5.8 g/dL — ABNORMAL LOW (ref 6.5–8.1)

## 2017-09-03 LAB — URINALYSIS, ROUTINE W REFLEX MICROSCOPIC
Bacteria, UA: NONE SEEN
Bilirubin Urine: NEGATIVE
Glucose, UA: NEGATIVE mg/dL
Ketones, ur: NEGATIVE mg/dL
LEUKOCYTES UA: NEGATIVE
Nitrite: NEGATIVE
PROTEIN: 30 mg/dL — AB
Specific Gravity, Urine: 1.009 (ref 1.005–1.030)
pH: 8 (ref 5.0–8.0)

## 2017-09-03 LAB — CBC
HCT: 31.5 % — ABNORMAL LOW (ref 36.0–46.0)
Hemoglobin: 10.4 g/dL — ABNORMAL LOW (ref 12.0–15.0)
MCH: 28.9 pg (ref 26.0–34.0)
MCHC: 33 g/dL (ref 30.0–36.0)
MCV: 87.5 fL (ref 78.0–100.0)
Platelets: 177 10*3/uL (ref 150–400)
RBC: 3.6 MIL/uL — ABNORMAL LOW (ref 3.87–5.11)
RDW: 15.2 % (ref 11.5–15.5)
WBC: 12.5 10*3/uL — ABNORMAL HIGH (ref 4.0–10.5)

## 2017-09-03 MED ORDER — IBUPROFEN 800 MG PO TABS
800.0000 mg | ORAL_TABLET | Freq: Four times a day (QID) | ORAL | Status: DC
  Administered 2017-09-03 – 2017-09-04 (×4): 800 mg via ORAL
  Filled 2017-09-03 (×4): qty 1

## 2017-09-03 MED ORDER — IBUPROFEN 100 MG/5ML PO SUSP
600.0000 mg | Freq: Four times a day (QID) | ORAL | Status: DC
  Filled 2017-09-03 (×2): qty 30

## 2017-09-03 MED ORDER — MORPHINE SULFATE (PF) 4 MG/ML IV SOLN
2.0000 mg | INTRAVENOUS | Status: DC | PRN
  Administered 2017-09-03 – 2017-09-04 (×2): 2 mg via INTRAVENOUS
  Filled 2017-09-03 (×3): qty 1

## 2017-09-03 MED ORDER — FAMOTIDINE 20 MG PO TABS
40.0000 mg | ORAL_TABLET | Freq: Every day | ORAL | Status: DC
  Administered 2017-09-03 – 2017-09-04 (×2): 40 mg via ORAL
  Filled 2017-09-03 (×2): qty 2

## 2017-09-03 MED ORDER — ONDANSETRON HCL 4 MG PO TABS
8.0000 mg | ORAL_TABLET | Freq: Three times a day (TID) | ORAL | Status: DC | PRN
  Administered 2017-09-03 – 2017-09-04 (×3): 8 mg via ORAL
  Filled 2017-09-03 (×3): qty 2

## 2017-09-03 MED ORDER — OXYCODONE-ACETAMINOPHEN 5-325 MG PO TABS
1.0000 | ORAL_TABLET | ORAL | Status: DC | PRN
  Administered 2017-09-03 – 2017-09-04 (×6): 1 via ORAL
  Filled 2017-09-03 (×6): qty 1

## 2017-09-03 MED ORDER — OXYCODONE HCL 5 MG PO TABS
5.0000 mg | ORAL_TABLET | ORAL | Status: DC | PRN
  Administered 2017-09-03 – 2017-09-04 (×6): 5 mg via ORAL
  Filled 2017-09-03 (×6): qty 1

## 2017-09-03 MED ORDER — IBUPROFEN 600 MG PO TABS
600.0000 mg | ORAL_TABLET | Freq: Four times a day (QID) | ORAL | Status: DC
  Administered 2017-09-03: 600 mg via ORAL
  Filled 2017-09-03: qty 1

## 2017-09-03 MED ORDER — CYCLOBENZAPRINE HCL 10 MG PO TABS
10.0000 mg | ORAL_TABLET | Freq: Three times a day (TID) | ORAL | Status: DC
  Administered 2017-09-03 – 2017-09-04 (×3): 10 mg via ORAL
  Filled 2017-09-03 (×3): qty 1

## 2017-09-03 NOTE — Progress Notes (Addendum)
Subjective: Postpartum Day 2: Cesarean Delivery Patient reports that pain is poorly-managed.Lochia normal.  Ambulating, voiding, tolerating diet as ordered without difficulty. Normal flatus.  Absent bowel movement. Given Toradol x 1 last night and started flexeril 10mg  TID prn due to uncontrolled pain Pt B/p's increased due to pain; Was called several times during the night but b/p directly related to pain level Complaint of bladder spasms; pt allergic to pyridium; urine cx sent Objective: Vital signs in last 24 hours: Temp:  [98.7 F (37.1 C)] 98.7 F (37.1 C) (04/25 0627) Pulse Rate:  [70-78] 78 (04/25 0627) Resp:  [18-80] 78 (04/25 0627) BP: (113-149)/(87-105) 133/105 (04/25 0627) SpO2:  [98 %-99 %] 98 % (04/25 16100627)  Physical Exam:  General: alert Lochia: appropriate Uterine Fundus: firm and appropriately tender Incision: dressing dry and clean DVT Evaluation: No evidence of DVT seen on physical exam. Edema none  Recent Labs    09/02/17 0509 09/03/17 0614  HGB 9.7* 10.4*  HCT 29.4* 31.5*   Results for orders placed or performed during the hospital encounter of 09/01/17 (from the past 24 hour(s))  Urinalysis, Routine w reflex microscopic     Status: Abnormal   Collection Time: 09/03/17 12:30 AM  Result Value Ref Range   Color, Urine STRAW (A) YELLOW   APPearance HAZY (A) CLEAR   Specific Gravity, Urine 1.009 1.005 - 1.030   pH 8.0 5.0 - 8.0   Glucose, UA NEGATIVE NEGATIVE mg/dL   Hgb urine dipstick LARGE (A) NEGATIVE   Bilirubin Urine NEGATIVE NEGATIVE   Ketones, ur NEGATIVE NEGATIVE mg/dL   Protein, ur 30 (A) NEGATIVE mg/dL   Nitrite NEGATIVE NEGATIVE   Leukocytes, UA NEGATIVE NEGATIVE   RBC / HPF >50 (H) 0 - 5 RBC/hpf   WBC, UA 11-20 0 - 5 WBC/hpf   Bacteria, UA NONE SEEN NONE SEEN  CBC     Status: Abnormal   Collection Time: 09/03/17  6:14 AM  Result Value Ref Range   WBC 12.5 (H) 4.0 - 10.5 K/uL   RBC 3.60 (L) 3.87 - 5.11 MIL/uL   Hemoglobin 10.4 (L) 12.0  - 15.0 g/dL   HCT 96.031.5 (L) 45.436.0 - 09.846.0 %   MCV 87.5 78.0 - 100.0 fL   MCH 28.9 26.0 - 34.0 pg   MCHC 33.0 30.0 - 36.0 g/dL   RDW 11.915.2 14.711.5 - 82.915.5 %   Platelets 177 150 - 400 K/uL  Comprehensive metabolic panel     Status: Abnormal   Collection Time: 09/03/17  6:14 AM  Result Value Ref Range   Sodium 138 135 - 145 mmol/L   Potassium 3.8 3.5 - 5.1 mmol/L   Chloride 104 101 - 111 mmol/L   CO2 25 22 - 32 mmol/L   Glucose, Bld 85 65 - 99 mg/dL   BUN 8 6 - 20 mg/dL   Creatinine, Ser 5.620.87 0.44 - 1.00 mg/dL   Calcium 8.4 (L) 8.9 - 10.3 mg/dL   Total Protein 5.8 (L) 6.5 - 8.1 g/dL   Albumin 2.6 (L) 3.5 - 5.0 g/dL   AST 22 15 - 41 U/L   ALT 13 (L) 14 - 54 U/L   Alkaline Phosphatase 134 (H) 38 - 126 U/L   Total Bilirubin 0.1 (L) 0.3 - 1.2 mg/dL   GFR calc non Af Amer >60 >60 mL/min   GFR calc Af Amer >60 >60 mL/min   Anion gap 9 5 - 15    Assessment/Plan: Status post Cesarean section. Postoperative course complicated by  chronic pain syndrome; anxiety  Pending social work consult Continue current care. Urine cx pending Anticipate discharge tomorrow  Elmore Guise Clemmons CNM 09/03/2017, 6:51 AM

## 2017-09-03 NOTE — Lactation Note (Signed)
This note was copied from a baby's chart. Lactation Consultation Note  Patient Name: Amber Hancock ZOXWR'UToday's Date: 09/03/2017 Reason for consult: Follow-up assessment;Difficult latch Baby is 47 hours old and at a 9% weight loss.  Pediatrician recommending formula supplementation.  Mom was started pumping with symphony pump this AM and obtaining drops.  Mom states baby has become very fussy at breast and "gassy".  Discussed supplementation and mom chooses to finger feed.  I instructed mom on finger feeding and baby took 15 mls with ease.  Baby relaxed after feeding.  Encouraged mom to rest and offer breast with feeding cues. She will continue to pump every 3 hours and supplement with expressed milk/formula.  Maternal Data    Feeding Feeding Type: Formula Length of feed: 5 min  LATCH Score Latch: Repeated attempts needed to sustain latch, nipple held in mouth throughout feeding, stimulation needed to elicit sucking reflex.  Audible Swallowing: None  Type of Nipple: Everted at rest and after stimulation  Comfort (Breast/Nipple): Soft / non-tender  Hold (Positioning): Assistance needed to correctly position infant at breast and maintain latch.(pt would not let RN assist, her mother was helping)  LATCH Score: 6  Interventions    Lactation Tools Discussed/Used Tools: Pump Breast pump type: Double-Electric Breast Pump   Consult Status Consult Status: Follow-up Date: 09/04/17 Follow-up type: In-patient    Amber FoleyMOULDEN, Yasmina Chico S 09/03/2017, 11:32 AM

## 2017-09-03 NOTE — Progress Notes (Signed)
Patient ID: Amber PitchAriel V Hancock, female   DOB: [birth year], 27 y.o.   MRN: [id number]  Called to patient bedside to evaluate for pain Patient notes that she is feeling pain both incisional , contractions and with urination from the catheter She notes that the dilaudid is not working for her pain.   Her Blood pressure goes up with increase in her pain.  PIH labs were negative. Patient w/o s/sx of Preeclampsia  Plan: Percocet 5/325 mg q 4-6 hour plus additional 5mg  oxycodone for 10mg  oxycodone  Morphine IV for breakthrough pain Flexeril TID instead of prn Change Motrin to 800mg  instead of 600 mg  Argyle Gustafson STACIA

## 2017-09-03 NOTE — Anesthesia Postprocedure Evaluation (Signed)
Anesthesia Post Note  Patient: Amber Hancock  Procedure(s) Performed: PRIMARY CESAREAN SECTION (N/A )     Patient location during evaluation: PACU Anesthesia Type: Spinal Level of consciousness: awake and alert Pain management: pain level controlled Vital Signs Assessment: post-procedure vital signs reviewed and stable Respiratory status: spontaneous breathing and respiratory function stable Cardiovascular status: blood pressure returned to baseline and stable Postop Assessment: no headache, no backache, spinal receding and no apparent nausea or vomiting Anesthetic complications: no    Last Vitals:  Vitals:   09/03/17 0303 09/03/17 0627  BP: 113/87 (!) 133/105  Pulse: 70 78  Resp:  (!) 78  Temp:  37.1 C  SpO2:  98%    Last Pain:  Vitals:   09/03/17 0806  TempSrc:   PainSc: 7                  Phillips Groutarignan, Graylyn Bunney

## 2017-09-03 NOTE — Progress Notes (Signed)
CSW received consult due to score 10 on Edinburgh Depression Screen.  When CSW arrived, MOB was resting in bed and FOB was holding infant.  MOB gave CSW permission to complete the assessment while FOB was present. MOB and FOB were polite and receptive to meeting with CSW.  They both appeared supportive of one another.   MOB asked about MOB's thoughts and feeling about being a new mother.  MOB expressed feelings of excitement but also shared feeling overwhelmed and nervous.  CSW validated and normalized MOB's thoughts and feelings. MOB also reported that MOB's OB provided had prescribed MOB Prozac today and MOB felt relieved that she can manage her symptoms with medication again.   CSW provided education regarding Baby Blues vs PMADs. CSW encouraged MOB to evaluate her mental health throughout the postpartum period with the use of the New Mom Checklist developed by Postpartum Progress and notify a medical professional if symptoms arise. CSW assessed for safety and MOB denied SI and HI. MOB was receptive to resources for outpatient therapy.  MOB reports having all essential items for infant and feeling prepared to parent.    There are no barriers to d/c.  Jahni Paul Boyd-Gilyard, MSW, LCSW Clinical Social Work [phone] 

## 2017-09-04 ENCOUNTER — Other Ambulatory Visit: Payer: Self-pay

## 2017-09-04 LAB — URINE CULTURE: Culture: NO GROWTH

## 2017-09-04 MED ORDER — FLUOXETINE HCL 20 MG PO CAPS: 20.0000 mg | ORAL_CAPSULE | Freq: Every day | ORAL | 4 refills | Status: AC

## 2017-09-04 MED ORDER — CYCLOBENZAPRINE HCL 10 MG PO TABS: 10.0000 mg | ORAL_TABLET | Freq: Three times a day (TID) | ORAL | 0 refills | Status: AC | PRN

## 2017-09-04 MED ORDER — IBUPROFEN 800 MG PO TABS: 800.0000 mg | ORAL_TABLET | Freq: Four times a day (QID) | ORAL | 2 refills | Status: AC | PRN

## 2017-09-04 MED ORDER — ONDANSETRON HCL 8 MG PO TABS: 8.0000 mg | ORAL_TABLET | Freq: Three times a day (TID) | ORAL | 2 refills | Status: AC | PRN

## 2017-09-04 MED ORDER — NORETHINDRONE 0.35 MG PO TABS: 1.0000 | ORAL_TABLET | Freq: Every day | ORAL | 11 refills | Status: AC

## 2017-09-04 MED ORDER — OXYCODONE-ACETAMINOPHEN 5-325 MG PO TABS: 1.0000 | ORAL_TABLET | Freq: Four times a day (QID) | ORAL | 0 refills | Status: AC | PRN

## 2017-09-04 NOTE — Lactation Note (Signed)
This note was copied from a baby's chart. Lactation Consultation Note  Patient Name: Amber Hancock Today's Date: 09/04/2017    [facility]  G1P1 mother whose infant is now 7169 hours of age.  Infant is 39 weeks with a 10% weight loss.  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    She is breastfeeding and supplementing due to infant's weight loss.  She has been using breast massage and hand expression prior to latching infant on.  LC encouraged to allow infant to latch on and feed for at least 15-20 minutes.  Some of her feedings have only been 5-10 minutes in duration.  Mother states baby has been gassy and fussy quite often.  She is also pumping and LC encouraged to continue pumping at least 4-6 times/24 hours until infant is back to birthweight and also so she may supplement with EBM instead of formula.  Mother has tried many different tools for feeding baby and she states that she likes the curved tip syringe because FOB can help and enjoys feeding this way.  Offered to watch infant latch and mother agreed.  Her breasts are very full.  Her colostrum is transitioning and she feels heavier.  She has expressed approximately 5 mls after last feeding.  Infant put to right breast in the football hold but remains fussy.  LC helped to burp and baby had a small emesis  of curdled formula and many small burps.  She calmed down after burping and latch attempted again.  She had wide open mouth with flanged lips but did not appear to be interested in feeding at this time.    Plan for Home: Breast massage and hand expression prior to latch.  Breast compressions during latch and to feed at breast longer than 5-10 minutes.  Hand expression and pump after breastfeeding at least 4-6 times/24 hours.  Supplement baby with 20-25 mls after nursing to increase weight gain.  Mother states her return pediatric visit is on Monday so LC  reinforced the importance of supplementing so baby does not lose too much weight.  Mother seems very knowledgeable and interested in continuing her plan at home.  Engorgement prevention/treatment discussed.    Mother has a manual pump and a DEBP for home use.  Mom made aware of O/P services, breastfeeding support groups, community resources, and our phone # for post-discharge questions.                Gearold Wainer R Jamee Pacholski 09/04/2017, 9:13 AM

## 2017-09-04 NOTE — Progress Notes (Signed)
[facility] notified of patient's discharge today and need to be seen Monday or Tuesday for BP check and honeycomb removal. [facility] and acknowledged request.

## 2017-09-04 NOTE — Discharge Instructions (Signed)
Postpartum Care After Cesarean Delivery °The period of time right after you deliver your newborn is called the postpartum period. °What kind of medical care will I receive? °· You may continue to receive fluids and medicines through an IV tube inserted into one of your veins. °· You may have small, flexible tube (catheter) draining urine from your bladder into a bag outside of your body. The catheter will be removed as soon as possible. °· You may be given a squirt bottle to use when you go to the bathroom. You may use this until you are comfortable wiping as usual. To use the squirt bottle, follow these steps: °? Before you urinate, fill the squirt bottle with warm water. The water should be warm. Do not use hot water. °? After you urinate, while you are sitting on the toilet, use the squirt bottle to rinse the area around your urethra and vaginal opening. This rinses away any urine and blood. °? You may do this instead of wiping. As you start healing, you may use the squirt bottle before wiping yourself. Make sure to wipe gently. °? Fill the squirt bottle with clean water every time you use the bathroom. °· You will be given sanitary pads to wear. °· Your incision will be monitored to make sure it is healing properly. You will be told when it is safe for your stitches, staples, or skin adhesive tape to be removed. °What can I expect? °· You may not feel the need to urinate for several hours after delivery. °· You will have some soreness and pain in your abdomen. You may have a small amount of blood or clear fluid coming from your incision. °· If you are breastfeeding, you may have uterine contractions every time you breastfeed for up to several weeks postpartum. Uterine contractions help your uterus return to its normal size. °· It is normal to have vaginal bleeding (lochia) after delivery. The amount and appearance of lochia is often similar to a menstrual period in the first week after delivery. It will  gradually decrease over the next few weeks to a dry, yellow-brown discharge. For most women, lochia stops completely by 6-8 weeks after delivery. Vaginal bleeding can vary from woman to woman. °· Within the first few days after delivery, you may have breast engorgement. This is when your breasts feel heavy, full, and uncomfortable. Your breasts may also throb and feel hard, tightly stretched, warm, and tender. After this occurs, you may have milk leaking from your breasts. Your health care provider can help you relieve discomfort due to breast engorgement. Breast engorgement should go away within a few days. °· You may feel more sad or worried than normal due to hormonal changes after delivery. These feelings should not last more than a few days. If these feelings do not go away after several days, speak with your health care provider. °How should I care for myself? °· Tell your health care provider if you have pain or discomfort. °· Drink enough water to keep your urine clear or pale yellow. °· Wash your hands thoroughly with soap and water for at least 20 seconds after changing your sanitary pads or using the toilet, and before holding or feeding your baby. °· If you are not breastfeeding, avoid touching your breasts a lot. Doing this can make your breasts produce more milk. °· If you become weak or lightheaded, or you feel like you might faint, ask for help before: °? Getting out of bed. °? Showering. °·   Change your sanitary pads frequently. Watch for any changes in your flow, such as a sudden increase in volume, a change in color, or the passing of large blood clots. If you pass a blood clot from your vagina, save it to show to your health care provider. Do not flush blood clots down the toilet without having your health care provider look at them. °· Make sure that all your vaccinations are up to date. This can help protect you and your baby from getting certain diseases. You may need to have immunizations done  before you leave the hospital. °· If desired, talk with your health care provider about methods of family planning or birth control (contraception). °How can I start bonding with my baby? °Spending as much time as possible with your baby is very important. During this time, you and your baby can get to know each other and develop a bond. Having your baby stay with you in your room (rooming in) can give you time to get to know your baby. Rooming in can also help you become comfortable caring for your baby. Breastfeeding can also help you bond with your baby. °How can I plan for returning home with my baby? °· Make sure that you have a car seat installed in your vehicle. °? Your car seat should be checked by a certified car seat installer to make sure that it is installed safely. °? Make sure that your baby fits into the car seat safely. °· Ask your health care provider any questions you have about caring for yourself or your baby. Make sure that you are able to contact your health care provider with any questions after leaving the hospital. °This information is not intended to replace advice given to you by your health care provider. Make sure you discuss any questions you have with your health care provider. °Document Released: 01/21/2012 Document Revised: 10/01/2015 Document Reviewed: 04/02/2015 °Elsevier Interactive Patient Education © 2018 [employer] ° ° °Postpartum Depression and Baby Blues °The postpartum period begins right after the birth of a baby. During this time, there is often a great amount of joy and excitement. It is also a time of many changes in the life of the parents. Regardless of how many times a mother gives birth, each child brings new challenges and dynamics to the family. It is not unusual to have feelings of excitement along with confusing shifts in moods, emotions, and thoughts. All mothers are at risk of developing postpartum depression or the "baby blues." These mood changes can occur  right after giving birth, or they may occur many months after giving birth. The baby blues or postpartum depression can be mild or severe. Additionally, postpartum depression can go away rather quickly, or it can be a long-term condition. °What are the causes? °Raised hormone levels and the rapid drop in those levels are thought to be a main cause of postpartum depression and the baby blues. A number of hormones change during and after pregnancy. Estrogen and progesterone usually decrease right after the delivery of your baby. The levels of thyroid hormone and various cortisol steroids also rapidly drop. Other factors that play a role in these mood changes include major life events and genetics. °What increases the risk? °If you have any of the following risks for the baby blues or postpartum depression, know what symptoms to watch out for during the postpartum period. Risk factors that may increase the likelihood of getting the baby blues or postpartum depression include: °·   Having a personal or family history of depression. °· Having depression while being pregnant. °· Having premenstrual mood issues or mood issues related to oral contraceptives. °· Having a lot of life stress. °· Having marital conflict. °· Lacking a social support network. °· Having a baby with special needs. °· Having health problems, such as diabetes. ° °What are the signs or symptoms? °Symptoms of baby blues include: °· Brief changes in mood, such as going from extreme happiness to sadness. °· Decreased concentration. °· Difficulty sleeping. °· Crying spells, tearfulness. °· Irritability. °· Anxiety. ° °Symptoms of postpartum depression typically begin within the first month after giving birth. These symptoms include: °· Difficulty sleeping or excessive sleepiness. °· Marked weight loss. °· Agitation. °· Feelings of worthlessness. °· Lack of interest in activity or food. ° °Postpartum psychosis is a very serious condition and can be  dangerous. Fortunately, it is rare. Displaying any of the following symptoms is cause for immediate medical attention. Symptoms of postpartum psychosis include: °· Hallucinations and delusions. °· Bizarre or disorganized behavior. °· Confusion or disorientation. ° °How is this diagnosed? °A diagnosis is made by an evaluation of your symptoms. There are no medical or lab tests that lead to a diagnosis, but there are various questionnaires that a health care provider may use to identify those with the baby blues, postpartum depression, or psychosis. Often, a screening tool called the Edinburgh Postnatal Depression Scale is used to diagnose depression in the postpartum period. °How is this treated? °The baby blues usually goes away on its own in 1-2 weeks. Social support is often all that is needed. You will be encouraged to get adequate sleep and rest. Occasionally, you may be given medicines to help you sleep. °Postpartum depression requires treatment because it can last several months or longer if it is not treated. Treatment may include individual or group therapy, medicine, or both to address any social, physiological, and psychological factors that may play a role in the depression. Regular exercise, a healthy diet, rest, and social support may also be strongly recommended. °Postpartum psychosis is more serious and needs treatment right away. Hospitalization is often needed. °Follow these instructions at home: °· Get as much rest as you can. Nap when the baby sleeps. °· Exercise regularly. Some women find yoga and walking to be beneficial. °· Eat a balanced and nourishing diet. °· Do little things that you enjoy. Have a cup of tea, take a bubble bath, read your favorite magazine, or listen to your favorite music. °· Avoid alcohol. °· Ask for help with household chores, cooking, grocery shopping, or running errands as needed. Do not try to do everything. °· Talk to people close to you about how you are feeling.  Get support from your partner, family members, friends, or other new moms. °· Try to stay positive in how you think. Think about the things you are grateful for. °· Do not spend a lot of time alone. °· Only take over-the-counter or prescription medicine as directed by your health care provider. °· Keep all your postpartum appointments. °· Let your health care provider know if you have any concerns. °Contact a health care provider if: °You are having a reaction to or problems with your medicine. °Get help right away if: °· You have suicidal feelings. °· You think you may harm the baby or someone else. °This information is not intended to replace advice given to you by your health care provider. Make sure you discuss any questions you have   with your health care provider. °Document Released: 01/31/2004 Document Revised: 10/04/2015 Document Reviewed: 02/07/2013 °Elsevier Interactive Patient Education © 2017 Elsevier Inc. ° °

## 2017-09-04 NOTE — Discharge Summary (Signed)
OB Discharge Summary     Patient Name: Amber Hancock DOB: [birth year] MRN: [id number]  Date of admission: 09/01/2017 Delivering MD: Osborn CohoOBERTS, ANGELA   Date of discharge: 09/04/2017  Admitting diagnosis: Elective Cesarean, h/o chronic pain syndrome Intrauterine pregnancy: 3742w0d     Secondary diagnosis:  Principal Problem:   S/P primary low transverse C-section  Additional problems: Anxiety, fibromyalgia     Discharge diagnosis: Term Pregnancy Delivered and elective cesarean, history of chronic pain                                                                                                Post partum procedures:None  Augmentation: None  Complications: None  Hospital course:  Sceduled C/S   27 y.o. yo G1P1001 at 3742w0d was admitted to the hospital 09/01/2017 for scheduled cesarean section with the following indication:Elective Primary.  Membrane Rupture Time/Date: 12:08 PM ,09/01/2017   Patient delivered a Viable infant 09/01/2017. Several instances of elevated blood pressure were noted due to poor pain management, negative PIH workup, Dr. Mora ApplPinn elected to defer blood pressure medication at this time. Patient was placed on a regimen of Flexiril, Ibuprofen, and Percocet with good pain management noted. Per Dr. Mora ApplPinn she will be sent home with these prescriptions. She will continue her Prozac, start Micronor after 3 weeks, and was also prescribed Zofran to help with nausea related to Percocet use. [facility] nurse to see the patient early next week for BP check and to remove honeycomb dressing.  Details of operation can be found in separate operative note.  Pateint had an uncomplicated postpartum course.  She is ambulating, tolerating a regular diet, passing flatus, and urinating well. Patient is dis  charged home in stable condition on  09/04/17         Physical exam  Vitals:   09/03/17 1400 09/03/17 1750 09/03/17 1929 09/04/17 0556  BP: 128/88 140/90 134/90 124/88  Pulse:  70 69 81   Resp:  19  (!) 81  Temp:  97.8 F (36.6 C)  97.9 F (36.6 C)  TempSrc:  Oral  Oral  SpO2:  99%    Weight:      Height:       General: alert, cooperative and no distress Lochia: appropriate Uterine Fundus: firm Incision: Healing well with no significant drainage, No significant erythema, small amount old drainage noted DVT Evaluation: No evidence of DVT seen on physical exam. Negative Homan's sign. No cords or calf tenderness. No significant calf/ankle edema. Labs: Lab Results  Component Value Date   WBC 12.5 (H) 09/03/2017   HGB 10.4 (L) 09/03/2017   HCT 31.5 (L) 09/03/2017   MCV 87.5 09/03/2017   PLT 177 09/03/2017   CMP Latest Ref Rng & Units 09/03/2017  Glucose 65 - 99 mg/dL 85  BUN 6 - 20 mg/dL 8  Creatinine 1.190.44 - 1.471.00 mg/dL 8.290.87  Sodium 562135 - 130145 mmol/L 138  Potassium 3.5 - 5.1 mmol/L 3.8  Chloride 101 - 111 mmol/L 104  CO2 22 - 32 mmol/L 25  Calcium 8.9 - 10.3 mg/dL 8.6(V8.4(L)  Total Protein 6.5 -  8.1 g/dL 1.6(X)  Total Bilirubin 0.3 - 1.2 mg/dL 0.9(U)  Alkaline Phos 38 - 126 U/L 134(H)  AST 15 - 41 U/L 22  ALT 14 - 54 U/L 13(L)    Discharge instruction: per After Visit Summary and "Baby and Me Booklet".  After visit meds:  Allergies as of 09/04/2017      Reactions   Adhesive [tape] Rash, Hives   Reaction to adhesive bandage after surgery Reaction to adhesive bandage after surgery   Gabapentin Other (See Comments)   Increased anxiety.  Did things she doesn't remember doing. Other reaction(s): Other Increased anxiety.Did things she doesn't remember doing.   Onion Hives   Oxycodone-acetaminophen Nausea And Vomiting   Tylox [oxycodone-acetaminophen] Nausea And Vomiting   Vicodin [hydrocodone-acetaminophen] Nausea And Vomiting   Isopropyl Alcohol, Rubbing    Makes skin burn   Other Hives   Reaction to adhesive bandage after surgery      Medication List    STOP taking these medications   acetaminophen 500 MG tablet Commonly known as:  TYLENOL    folic acid 1 MG tablet Commonly known as:  FOLVITE   metoCLOPramide 10 MG tablet Commonly known as:  REGLAN   NIFEdipine 10 MG capsule Commonly known as:  PROCARDIA   promethazine 25 MG tablet Commonly known as:  PHENERGAN   pyridoxine 100 MG tablet Commonly known as:  B-6     TAKE these medications   cyclobenzaprine 10 MG tablet Commonly known as:  FLEXERIL Take 1 tablet (10 mg total) by mouth 3 (three) times daily as needed for muscle spasms.   docusate sodium 100 MG capsule Commonly known as:  COLACE Take 100 mg by mouth at bedtime.   doxylamine (Sleep) 25 MG tablet Commonly known as:  UNISOM Take 25 mg by mouth at bedtime.   FLUoxetine 20 MG capsule Commonly known as:  PROZAC Take 1 capsule (20 mg total) by mouth daily.   ibuprofen 800 MG tablet Commonly known as:  ADVIL,MOTRIN Take 1 tablet (800 mg total) by mouth every 6 (six) hours as needed for mild pain or moderate pain.   norethindrone 0.35 MG tablet Commonly known as:  ORTHO MICRONOR Take 1 tablet (0.35 mg total) by mouth daily. Start taking on:  09/25/2017   ondansetron 8 MG tablet Commonly known as:  ZOFRAN Take 1 tablet (8 mg total) by mouth every 8 (eight) hours as needed for nausea or vomiting.   oxyCODONE-acetaminophen 5-325 MG tablet Commonly known as:  PERCOCET/ROXICET Take 1 tablet by mouth every 6 (six) hours as needed for moderate pain.   pantoprazole 40 MG tablet Commonly known as:  PROTONIX Take 40 mg by mouth at bedtime.   PRENATAL COMPLETE 14-0.4 MG Tabs Take 1 tablet by mouth daily.       Diet: routine diet  Activity: Advance as tolerated. Pelvic rest for 6 weeks.   Outpatient follow up:6 weeks Follow up Appt:No future appointments. Follow up Visit:No follow-ups on file.  Postpartum contraception: Progesterone only pills  Newborn Data: Live born female  Birth Weight: 7 lb 5.6 oz (3335 g) APGAR: 8, 9  Newborn Delivery   Birth date/time:  09/01/2017  12:08:00 Delivery type:  C-Section, Low Transverse Trial of labor:  No C-section categorization:  Primary     Baby Feeding: Breast Disposition:home with mother   09/04/2017 Nigel Bridgeman, CNM

## 2017-09-18 ENCOUNTER — Encounter (HOSPITAL_COMMUNITY): Payer: Self-pay | Admitting: *Deleted

## 2017-09-18 ENCOUNTER — Inpatient Hospital Stay (HOSPITAL_COMMUNITY)
Admission: AD | Admit: 2017-09-18 | Discharge: 2017-09-18 | Disposition: A | Discharge status: Home / Self Care | Payer: Medicaid Other | Source: Ambulatory Visit | Attending: Obstetrics & Gynecology | Admitting: Obstetrics & Gynecology

## 2017-09-18 DIAGNOSIS — F329 Major depressive disorder, single episode, unspecified: Secondary | ICD-10-CM | POA: Diagnosis not present

## 2017-09-18 DIAGNOSIS — Z79899 Other long term (current) drug therapy: Secondary | ICD-10-CM | POA: Insufficient documentation

## 2017-09-18 DIAGNOSIS — Z888 Allergy status to other drugs, medicaments and biological substances status: Secondary | ICD-10-CM | POA: Insufficient documentation

## 2017-09-18 DIAGNOSIS — M797 Fibromyalgia: Secondary | ICD-10-CM | POA: Diagnosis not present

## 2017-09-18 DIAGNOSIS — Z885 Allergy status to narcotic agent status: Secondary | ICD-10-CM | POA: Diagnosis not present

## 2017-09-18 DIAGNOSIS — N83201 Unspecified ovarian cyst, right side: Secondary | ICD-10-CM | POA: Diagnosis not present

## 2017-09-18 DIAGNOSIS — Z4800 Encounter for change or removal of nonsurgical wound dressing: Secondary | ICD-10-CM | POA: Diagnosis not present

## 2017-09-18 DIAGNOSIS — Z4889 Encounter for other specified surgical aftercare: Secondary | ICD-10-CM

## 2017-09-18 DIAGNOSIS — F419 Anxiety disorder, unspecified: Secondary | ICD-10-CM | POA: Insufficient documentation

## 2017-09-18 DIAGNOSIS — N83202 Unspecified ovarian cyst, left side: Secondary | ICD-10-CM | POA: Insufficient documentation

## 2017-09-18 MED ORDER — OXYCODONE HCL 5 MG PO TABS: 5.0000 mg | ORAL_TABLET | Freq: Four times a day (QID) | ORAL | 0 refills | Status: AC | PRN

## 2017-09-18 MED ORDER — IBUPROFEN 800 MG PO TABS: 800.0000 mg | ORAL_TABLET | Freq: Three times a day (TID) | ORAL | 0 refills | Status: AC | PRN

## 2017-09-18 NOTE — [facility]U Note (Addendum)
PP c/s on April 23 for abd musle issue that was not developed normal at birth. Didn't think could get pregnant.   Here dt c/s incisional area pain 3/10 with swelling and warm to touch.   Steri strips noted with dry blood on them. Pt states home health nurse took off the honeycomb dressing last week.

## 2017-09-18 NOTE — MAU Provider Note (Signed)
History     CSN: [id number]  Arrival date and time: 09/18/17 1444   First Provider Initiated Contact with Patient 09/18/17 1616      Chief Complaint  Patient presents with  . Post-op Problem    c/s april 23    HPI  27 yo presents for wound check.  She notes increase in swelling above the incision and warmth. She denies fever or chills. She denies nausea or vomiting.  She denies purulent discharge. She is having some abdominal pain.   OB History    Gravida  1   Para  1   Term  1   Preterm  0   AB  0   Living  1     SAB  0   TAB  0   Ectopic  0   Multiple  0   Live Births  1           Past Medical History:  Diagnosis Date  . Anxiety   . Bilateral ovarian cysts   . Chronic pelvic pain in female   . Depression   . Fibromyalgia    chronic myofacial pain  . Headache   . Malignant hyperthermia    MGM  . Streak gonad    Left  . Vaginal Pap smear, abnormal     Past Surgical History:  Procedure Laterality Date  . CESAREAN SECTION N/A 09/01/2017   Procedure: PRIMARY CESAREAN SECTION;  Surgeon: Osborn Coho, MD;  Location: [facility] BIRTHING SUITES;  Service: Obstetrics;  Laterality: N/A;  RNFA requested  . LAPAROTOMY    . SALPINGOOPHORECTOMY      Family History  Problem Relation Age of Onset  . Diverticulitis Mother   . Hypertension Mother   . Cancer Paternal Grandmother   . Anesthesia problems Paternal Grandmother   . Post-traumatic stress disorder Father   . Eczema Father   . Mental illness Brother   . Depression Sister   . Heart failure Maternal Grandfather   . Allergic rhinitis Neg Hx   . Asthma Neg Hx   . Angioedema Neg Hx   . Urticaria Neg Hx     Social History   Tobacco Use  . Smoking status: Never Smoker  . Smokeless tobacco: Never Used  Substance Use Topics  . Alcohol use: Not Currently    Alcohol/week: 0.0 - 1.2 oz    Comment: occasional  . Drug use: No    Allergies:  Allergies  Allergen Reactions  . Adhesive [Tape] Rash  and Hives    Reaction to adhesive bandage after surgery Reaction to adhesive bandage after surgery  . Gabapentin Other (See Comments)    Increased anxiety.  Did things she doesn't remember doing. Other reaction(s): Other Increased anxiety.Did things she doesn't remember doing.  . Onion Hives  . Oxycodone-Acetaminophen Nausea And Vomiting  . Tylox [Oxycodone-Acetaminophen] Nausea And Vomiting  . Vicodin [Hydrocodone-Acetaminophen] Nausea And Vomiting  . Isopropyl Alcohol, Rubbing     Makes skin burn  . Other Hives    Reaction to adhesive bandage after surgery    Medications Prior to Admission  Medication Sig Dispense Refill Last Dose  . cyclobenzaprine (FLEXERIL) 10 MG tablet Take 1 tablet (10 mg total) by mouth 3 (three) times daily as needed for muscle spasms. 30 tablet 0   . docusate sodium (COLACE) 100 MG capsule Take 100 mg by mouth at bedtime.   08/21/2017 at Unknown time  . doxylamine, Sleep, (UNISOM) 25 MG tablet Take 25 mg by mouth at  bedtime.   08/21/2017 at Unknown time  . FLUoxetine (PROZAC) 20 MG capsule Take 1 capsule (20 mg total) by mouth daily. 30 capsule 4   . ibuprofen (ADVIL,MOTRIN) 800 MG tablet Take 1 tablet (800 mg total) by mouth every 6 (six) hours as needed for mild pain or moderate pain. 30 tablet 2   . [START ON 09/25/2017] norethindrone (ORTHO MICRONOR) 0.35 MG tablet Take 1 tablet (0.35 mg total) by mouth daily. 1 Package 11   . ondansetron (ZOFRAN) 8 MG tablet Take 1 tablet (8 mg total) by mouth every 8 (eight) hours as needed for nausea or vomiting. 20 tablet 2   . oxyCODONE-acetaminophen (PERCOCET/ROXICET) 5-325 MG tablet Take 1 tablet by mouth every 6 (six) hours as needed for moderate pain. 24 tablet 0   . pantoprazole (PROTONIX) 40 MG tablet Take 40 mg by mouth at bedtime.   08/21/2017 at Unknown time  . Prenatal Vit-Fe Fumarate-FA (PRENATAL COMPLETE) 14-0.4 MG TABS Take 1 tablet by mouth daily. 60 each 0 08/21/2017 at Unknown time    Review of  Systems Physical Exam   Blood pressure (!) 114/99, pulse (!) 103, temperature 98.4 F (36.9 C), temperature source Oral, resp. rate 18, unknown if currently breastfeeding.  Physical Exam Abdomen: Pfannenstiel incision: steri strips removed. Incision well healing, well approximated,  Clean dry intact, no erythema, no induration no warmth no discharge or bleeding  MAU Course  Procedures  MDM Wound wiped down  Assessment and Plan  Patient given precautions about wound care and s/sx of infection She will f/u in the office as needed and for her post partum visit Will give a few percocet and ibuprofen for pain.   Amber Hancock 09/18/2017, 4:19 PM

## 2019-02-13 ENCOUNTER — Emergency Department (HOSPITAL_BASED_OUTPATIENT_CLINIC_OR_DEPARTMENT_OTHER)
Admission: EM | Admit: 2019-02-13 | Discharge: 2019-02-13 | Disposition: A | Discharge status: Home / Self Care | Payer: Medicaid Other | Attending: Emergency Medicine | Admitting: Emergency Medicine

## 2019-02-13 ENCOUNTER — Emergency Department (HOSPITAL_BASED_OUTPATIENT_CLINIC_OR_DEPARTMENT_OTHER): Payer: Medicaid Other

## 2019-02-13 ENCOUNTER — Encounter (HOSPITAL_BASED_OUTPATIENT_CLINIC_OR_DEPARTMENT_OTHER): Payer: Self-pay | Admitting: *Deleted

## 2019-02-13 ENCOUNTER — Other Ambulatory Visit: Payer: Self-pay

## 2019-02-13 DIAGNOSIS — K5900 Constipation, unspecified: Secondary | ICD-10-CM | POA: Insufficient documentation

## 2019-02-13 DIAGNOSIS — F840 Autistic disorder: Secondary | ICD-10-CM | POA: Insufficient documentation

## 2019-02-13 DIAGNOSIS — Z79899 Other long term (current) drug therapy: Secondary | ICD-10-CM | POA: Diagnosis not present

## 2019-02-13 DIAGNOSIS — R1030 Lower abdominal pain, unspecified: Secondary | ICD-10-CM | POA: Diagnosis present

## 2019-02-13 HISTORY — DX: Attention-deficit hyperactivity disorder, unspecified type: F90.9

## 2019-02-13 HISTORY — DX: Autistic disorder: F84.0

## 2019-02-13 LAB — URINALYSIS, ROUTINE W REFLEX MICROSCOPIC
Bilirubin Urine: NEGATIVE
Glucose, UA: NEGATIVE mg/dL
Hgb urine dipstick: NEGATIVE
Ketones, ur: >80 mg/dL — AB
Leukocytes,Ua: NEGATIVE
Nitrite: NEGATIVE
Protein, ur: NEGATIVE mg/dL
Specific Gravity, Urine: >1.03 — ABNORMAL HIGH (ref 1.005–1.030)
pH: 6 (ref 5.0–8.0)

## 2019-02-13 LAB — COMPREHENSIVE METABOLIC PANEL
ALT: 10 U/L (ref 0–44)
AST: 13 U/L — ABNORMAL LOW (ref 15–41)
Albumin: 4.9 g/dL (ref 3.5–5.0)
Alkaline Phosphatase: 52 U/L (ref 38–126)
Anion gap: 12 (ref 5–15)
BUN: 13 mg/dL (ref 6–20)
CO2: 22 mmol/L (ref 22–32)
Calcium: 9.2 mg/dL (ref 8.9–10.3)
Chloride: 103 mmol/L (ref 98–111)
Creatinine, Ser: 0.9 mg/dL (ref 0.44–1.00)
GFR calc Af Amer: >60 mL/min (ref >60)
GFR calc non Af Amer: >60 mL/min (ref >60)
Glucose, Bld: 88 mg/dL (ref 70–99)
Potassium: 3.6 mmol/L (ref 3.5–5.1)
Sodium: 137 mmol/L (ref 135–145)
Total Bilirubin: 0.8 mg/dL (ref 0.3–1.2)
Total Protein: 7.7 g/dL (ref 6.5–8.1)

## 2019-02-13 LAB — CBC WITH DIFFERENTIAL/PLATELET
Abs Immature Granulocytes: 0.04 10*3/uL (ref 0.00–0.07)
Basophils Absolute: 0.1 10*3/uL (ref 0.0–0.1)
Basophils Relative: 1 %
Eosinophils Absolute: 0.3 10*3/uL (ref 0.0–0.5)
Eosinophils Relative: 2 %
HCT: 40.1 % (ref 36.0–46.0)
Hemoglobin: 13.2 g/dL (ref 12.0–15.0)
Immature Granulocytes: 0 %
Lymphocytes Relative: 22 %
Lymphs Abs: 2.6 10*3/uL (ref 0.7–4.0)
MCH: 28.9 pg (ref 26.0–34.0)
MCHC: 32.9 g/dL (ref 30.0–36.0)
MCV: 87.9 fL (ref 80.0–100.0)
Monocytes Absolute: 0.5 10*3/uL (ref 0.1–1.0)
Monocytes Relative: 4 %
Neutro Abs: 8.5 10*3/uL — ABNORMAL HIGH (ref 1.7–7.7)
Neutrophils Relative %: 71 %
Platelets: 286 10*3/uL (ref 150–400)
RBC: 4.56 MIL/uL (ref 3.87–5.11)
RDW: 12.8 % (ref 11.5–15.5)
WBC: 12 10*3/uL — ABNORMAL HIGH (ref 4.0–10.5)
nRBC: 0 % (ref 0.0–0.2)

## 2019-02-13 LAB — OCCULT BLOOD X 1 CARD TO LAB, STOOL: Fecal Occult Bld: NEGATIVE

## 2019-02-13 LAB — PREGNANCY, URINE: Preg Test, Ur: NEGATIVE

## 2019-02-13 LAB — LIPASE, BLOOD: Lipase: 27 U/L (ref 11–51)

## 2019-02-13 MED ORDER — ACETAMINOPHEN 325 MG PO TABS
650.0000 mg | ORAL_TABLET | Freq: Once | ORAL | Status: AC
  Administered 2019-02-13: 650 mg via ORAL
  Filled 2019-02-13: qty 2

## 2019-02-13 MED ORDER — IOHEXOL 300 MG/ML  SOLN
100.0000 mL | Freq: Once | INTRAMUSCULAR | Status: AC | PRN
  Administered 2019-02-13: 100 mL via INTRAVENOUS

## 2019-02-13 MED ORDER — SODIUM CHLORIDE 0.9 % IV BOLUS
1000.0000 mL | Freq: Once | INTRAVENOUS | Status: AC
  Administered 2019-02-13: 1000 mL via INTRAVENOUS

## 2019-02-13 NOTE — ED Provider Notes (Signed)
[facility] EMERGENCY DEPARTMENT Provider Note   CSN: [id number] Arrival date & time: 02/13/19  [id number]     History   Chief Complaint Chief Complaint  Patient presents with   Back Pain    HPI Amber Hancock is a 28 y.o. female with past medical history significant for anxiety, autism, IBS, chronic pelvic pain in female, fibromyalgia, depression presents to emergency department today with chief complaint of abdominal pain x 1 day.  She states pain is located in lower abdomen and feels like a pressure.  Pain radiates to her back.  She rates pain 8 out of 10 in severity.  She denies any medications for symptoms prior to arrival.  Pain is worse with movement.  She had two bowel movements today consistent with constipation, describes them as small hard pieces.  She reports associated anorexia and nausea without emesis.  She did drink an Ensure shake and was able to tolerate that without difficulty. Denies fever, chills, chest pain, cough,shortness of breath, blood in stool, urinary frequency, gross hematuria. Surgical history includes C-section, laparotomy,  and left salpingoophorectomy. History provided by patient with additional history obtained from chart review.     Past Medical History:  Diagnosis Date   ADHD    Anxiety    Autism    Bilateral ovarian cysts    Chronic pelvic pain in female    Depression    Fibromyalgia    chronic myofacial pain   Headache    Malignant hyperthermia    MGM   Streak gonad    Left   Vaginal Pap smear, abnormal     Patient Active Problem List   Diagnosis Date Noted   S/P primary low transverse C-section 09/01/2017   Preterm labor 08/09/2017   Fibromyalgia 05/03/2015   Chronic pain associated with significant psychosocial dysfunction 05/02/2015   Anxiety 02/27/2015   Spasm 09/21/2013   Infrequent menses 09/21/2013   Pelvic pain 06/15/2013   Pelvic and perineal pain 06/15/2013   Chronic female pelvic pain  08/14/2012   Syncope 02/11/2012    Past Surgical History:  Procedure Laterality Date   CESAREAN SECTION N/A 09/01/2017   Procedure: PRIMARY CESAREAN SECTION;  Surgeon: Osborn Cohooberts, Angela, MD;  [facility];  Service: Obstetrics;  Laterality: N/A;  RNFA requested   LAPAROTOMY     SALPINGOOPHORECTOMY       OB History    Gravida  1   Para  1   Term  1   Preterm  0   AB  0   Living  1     SAB  0   TAB  0   Ectopic  0   Multiple  0   Live Births  1            Home Medications    Prior to Admission medications   Medication Sig Start Date End Date Taking? Authorizing Provider  cyclobenzaprine (FLEXERIL) 10 MG tablet Take 1 tablet (10 mg total) by mouth 3 (three) times daily as needed for muscle spasms. 09/04/17   Nigel BridgemanLatham, Vicki, CNM  doxylamine, Sleep, (UNISOM) 25 MG tablet Take 25 mg by mouth at bedtime.    [provider]  FLUoxetine (PROZAC) 20 MG capsule Take 1 capsule (20 mg total) by mouth daily. 09/04/17   Nigel BridgemanLatham, Vicki, CNM  ibuprofen (ADVIL,MOTRIN) 800 MG tablet Take 1 tablet (800 mg total) by mouth every 6 (six) hours as needed for mild pain or moderate pain. 09/04/17   Nigel BridgemanLatham, Vicki,  CNM  ibuprofen (ADVIL,MOTRIN) 800 MG tablet Take 1 tablet (800 mg total) by mouth every 8 (eight) hours as needed. 09/18/17   Essie Hart, MD  norethindrone (ORTHO MICRONOR) 0.35 MG tablet Take 1 tablet (0.35 mg total) by mouth daily. 09/25/17   Nigel Bridgeman, CNM  ondansetron (ZOFRAN) 8 MG tablet Take 1 tablet (8 mg total) by mouth every 8 (eight) hours as needed for nausea or vomiting. 09/04/17   Nigel Bridgeman, CNM  oxyCODONE (OXY IR/ROXICODONE) 5 MG immediate release tablet Take 1 tablet (5 mg total) by mouth every 6 (six) hours as needed for severe pain. 09/18/17   Essie Hart, MD  oxyCODONE-acetaminophen (PERCOCET/ROXICET) 5-325 MG tablet Take 1 tablet by mouth every 6 (six) hours as needed for moderate pain. 09/04/17   Nigel Bridgeman, CNM  pantoprazole  (PROTONIX) 40 MG tablet Take 40 mg by mouth at bedtime.    [provider]  Prenatal Vit-Fe Fumarate-FA (PRENATAL COMPLETE) 14-0.4 MG TABS Take 1 tablet by mouth daily. 01/13/17   Dorena Bodo, NP    Family History Family History  Problem Relation Age of Onset   Diverticulitis Mother    Hypertension Mother    Cancer Paternal Grandmother    Anesthesia problems Paternal Grandmother    Post-traumatic stress disorder Father    Eczema Father    Mental illness Brother    Depression Sister    Heart failure Maternal Grandfather    Allergic rhinitis Neg Hx    Asthma Neg Hx    Angioedema Neg Hx    Urticaria Neg Hx     Social History Social History   Tobacco Use   Smoking status: Never Smoker   Smokeless tobacco: Never Used  Substance Use Topics   Alcohol use: Not Currently    Alcohol/week: 0.0 - 2.0 standard drinks    Comment: occasional   Drug use: No     Allergies   Adhesive [tape], Gabapentin, Onion, Oxycodone-acetaminophen, Tylox [oxycodone-acetaminophen], Vicodin [hydrocodone-acetaminophen], Isopropyl alcohol, Other, and Fluoxetine   Review of Systems Review of Systems  Constitutional: Positive for appetite change. Negative for chills, diaphoresis, fever and unexpected weight change.  Eyes: Negative for pain.  Respiratory: Negative for cough and shortness of breath.   Cardiovascular: Negative for chest pain.  Gastrointestinal: Positive for abdominal pain, constipation and nausea. Negative for abdominal distention, anal bleeding, blood in stool, diarrhea, rectal pain and vomiting.  Genitourinary: Negative for difficulty urinating, dysuria, flank pain, hematuria, pelvic pain, vaginal bleeding, vaginal discharge and vaginal pain.  Musculoskeletal: Positive for back pain. Negative for arthralgias, joint swelling and neck pain.  Skin: Negative for wound.  Neurological: Negative for syncope, weakness, light-headedness, numbness and headaches.      Physical Exam Updated Vital Signs BP 127/70    Pulse 96    Temp 98.7 F (37.1 C) (Oral)    Resp 18    Ht  (1.626 m)    Wt 52.2 kg    LMP 12/31/2018    SpO2 100%    Breastfeeding No    BMI 19.74 kg/m   Physical Exam Vitals signs and nursing note reviewed.  Constitutional:      General: She is not in acute distress.    Appearance: She is not ill-appearing.  HENT:     Head: Normocephalic and atraumatic.     Right Ear: Tympanic membrane and external ear normal.     Left Ear: Tympanic membrane and external ear normal.     Nose: Nose normal.  Mouth/Throat:     Mouth: Mucous membranes are moist.     Pharynx: Oropharynx is clear.  Eyes:     General: No scleral icterus.       Right eye: No discharge.        Left eye: No discharge.     Extraocular Movements: Extraocular movements intact.     Conjunctiva/sclera: Conjunctivae normal.     Pupils: Pupils are equal, round, and reactive to light.  Neck:     Musculoskeletal: Normal range of motion.     Vascular: No JVD.  Cardiovascular:     Rate and Rhythm: Normal rate and regular rhythm.     Pulses: Normal pulses.          Radial pulses are 2+ on the right side and 2+ on the left side.     Heart sounds: Normal heart sounds.  Pulmonary:     Comments: Lungs clear to auscultation in all fields. Symmetric chest rise. No wheezing, rales, or rhonchi. Abdominal:     Tenderness: There is no right CVA tenderness or left CVA tenderness.     Comments: Abdomen is soft, non-distended. Tenderness to palpation of suprapubic area. No rigidity, no guarding. No peritoneal signs.  Genitourinary:    Comments: Chaperone Risk analyst present for exam. Digital Rectal Exam reveals sphincter with good tone. Small external hemorrhoid in 4 o'clock position without signs of thrombosis. No masses or fissures. Stool color is brown with no overt blood. No gross melena.  Musculoskeletal: Normal range of motion.  Skin:    General: Skin is warm and dry.      Capillary Refill: Capillary refill takes less than 2 seconds.  Neurological:     Mental Status: She is oriented to person, place, and time.     GCS: GCS eye subscore is 4. GCS verbal subscore is 5. GCS motor subscore is 6.     Comments: Fluent speech, no facial droop.  Psychiatric:        Behavior: Behavior normal.      ED Treatments / Results  Labs (all labs ordered are listed, but only abnormal results are displayed) Labs Reviewed  COMPREHENSIVE METABOLIC PANEL - Abnormal; Notable for the following components:      Result Value   AST 13 (*)    All other components within normal limits  CBC WITH DIFFERENTIAL/PLATELET - Abnormal; Notable for the following components:   WBC 12.0 (*)    Neutro Abs 8.5 (*)    All other components within normal limits  URINALYSIS, ROUTINE W REFLEX MICROSCOPIC - Abnormal; Notable for the following components:   Specific Gravity, Urine >1.030 (*)    Ketones, ur >80 (*)    All other components within normal limits  LIPASE, BLOOD  PREGNANCY, URINE  OCCULT BLOOD X 1 CARD TO LAB, STOOL    EKG None  Radiology Ct Abdomen Pelvis W Contrast  Result Date: 02/13/2019 CLINICAL DATA:  Acute abdominal pain EXAM: CT ABDOMEN AND PELVIS WITH CONTRAST TECHNIQUE: Multidetector CT imaging of the abdomen and pelvis was performed using the standard protocol following bolus administration of intravenous contrast. CONTRAST:  118mL OMNIPAQUE IOHEXOL 300 MG/ML  SOLN COMPARISON:  None. FINDINGS: Lower chest: The visualized heart size within normal limits. No pericardial fluid/thickening. No hiatal hernia. The visualized portions of the lungs are clear. Hepatobiliary: The liver is normal in density without focal abnormality.The main portal vein is patent. No evidence of calcified gallstones, gallbladder wall thickening or biliary dilatation. Pancreas: Unremarkable. No pancreatic ductal  dilatation or surrounding inflammatory changes. Spleen: Normal in size without focal  abnormality. Adrenals/Urinary Tract: Both adrenal glands appear normal. The kidneys and collecting system appear normal without evidence of urinary tract calculus or hydronephrosis. Bladder is unremarkable. Stomach/Bowel: The stomach, small bowel, and colon are normal in appearance. No inflammatory changes, wall thickening, or obstructive findings.There is a moderate amount of colonic stool. The appendix is normal in appearance. Vascular/Lymphatic: There are no enlarged mesenteric, retroperitoneal, or pelvic lymph nodes. No significant vascular findings are present. Reproductive: There is a probable right corpus luteum cyst. A small amount of free fluid seen within the cul-de-sac. Small amount of fluid in the endometrial canal. Other: No evidence of abdominal wall mass or hernia. Musculoskeletal: No acute or significant osseous findings. Mild dextroconvex scoliotic curvature of the lumbar spine. IMPRESSION: Moderate amount of colonic stool without evidence of obstruction. Probable right corpus luteum cyst. Electronically Signed   By: Jonna Clark M.D.   On: 02/13/2019 21:50    Procedures Procedures (including critical care time)  Medications Ordered in ED Medications  sodium chloride 0.9 % bolus 1,000 mL (0 mLs Intravenous Stopped 02/13/19 2320)  acetaminophen (TYLENOL) tablet 650 mg (650 mg Oral Given 02/13/19 2101)  iohexol (OMNIPAQUE) 300 MG/ML solution 100 mL (100 mLs Intravenous Contrast Given 02/13/19 2127)     Initial Impression / Assessment and Plan / ED Course  I have reviewed the triage vital signs and the nursing notes.  Pertinent labs & imaging results that were available during my care of the patient were reviewed by me and considered in my medical decision making (see chart for details).  Patient presents to the ED with complaints of abdominal pain. Patient nontoxic appearing, in no apparent distress, vitals WNL. On exam patient tender to suprapubic region, no peritoneal signs. Rectal  exam performed as pt admitted to pressure and numbness radiating to back. Normal rectal tone, external non thrombosed hemorrhoid seen. Will evaluate with labs and CT A/P. Tylenol and zofran given.  Labs reviewed. Minimal leukocytosis 12.0,  no anemia, no significant electrolyte derangements. LFTs, renal function, and lipase WNL. Pregnancy test negative. Fecal occult negative. Urinalysis without obvious infection, but does show signs of dehydration. IVF given.  Imaging shows moderate colonic stool burden without obstruction.  On repeat abdominal exam patient remains without peritoneal signs. Pain has improved after tylenol. Pt offered enema while here in the ED, she declines. Patient tolerating PO in the emergency department. Will discharge home with constipation treatment. I discussed results, treatment plan, need for PCP follow-up, and return precautions with the patient. Provided opportunity for questions, patient confirmed understanding and is in agreement with plan. Findings and plan of care discussed with supervising physician Dr. Particia Nearing.    Portions of this note were generated with Scientist, clinical (histocompatibility and immunogenetics). Dictation errors may occur despite best attempts at proofreading.  Final Clinical Impressions(s) / ED Diagnoses   Final diagnoses:  Constipation, unspecified constipation type    ED Discharge Orders    None       Sherene Sires, PA-C 02/14/19 0134    Jacalyn Lefevre, MD 02/17/19 [phone]

## 2019-02-13 NOTE — ED Triage Notes (Signed)
Reports low back pain last night. When she woke up this morning she was having abdominal pain and states  "felt like I needed to have a BM". C/o pressure in her tailbone. Pt has hx of autism and gyn surgery

## 2019-02-13 NOTE — ED Notes (Signed)
Patient transported to CT 

## 2019-02-13 NOTE — ED Notes (Signed)
ED Provider at bedside. 

## 2019-02-13 NOTE — Discharge Instructions (Addendum)
To help reduce constipation and promote bowel health, 1. Drink at least 64 ounces of water each day; 2. Eat plenty of fiber (fruits, vegetables, whole grains, legumes) 3. Get plenty of physical activity  If needed, you may also use daily or as needed, MiraLax (Osmotic Laxative) up to  1-2 times a day and Colace (Stool Softener aka Docusate) 100 mg up to twice a day to help with bowel movements. These medications are over the counter.  MiraLax is an Osmotic You may use other over-the-counter medications such as Dulcolax, Fleet enemas, magnesium citrate as needed for constipation. Please note that some of these medications may cause you to have abdominal cramping which is normal. If you develop severe abdominal pain, fever, vomiting, distention of your abdomen, unable to have a bowel movement for 5 days or are not passing gas, please return to the hospital.  Return to the Emergency Department for any fever, worsening pain, blood in stool, severe abdominal pain, or any other worsening or concerning symptoms.  -I have given you information for a GI practice in [place].  Please call the office to schedule a follow-up appointment to discuss IBS and constipation.  -Also recommend you follow-up with your OB GYN for further evaluation as we discussed.  Please call the office tomorrow to schedule next available appointment.

## 2019-06-02 IMAGING — US US OB COMP LESS 14 WK
1 series · 15 of 28 positions shown · non-contrast
Comparison: None.

CLINICAL DATA: Pregnant, cramping, for viability/dating

EXAM:
OBSTETRIC <14 WK US AND TRANSVAGINAL OB US
TECHNIQUE: Both transabdominal and transvaginal ultrasound examinations were
performed for complete evaluation of the gestation as well as the
maternal uterus, adnexal regions, and pelvic cul-de-sac.
Transvaginal technique was performed to assess early pregnancy.

[Series 1: us ob comp less 14 wk · 15 of 28 slices shown]
[im 1/28]
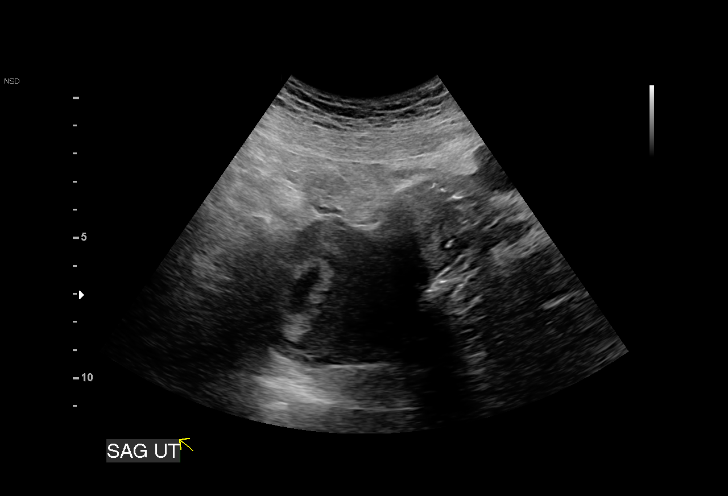
[im 3/28]
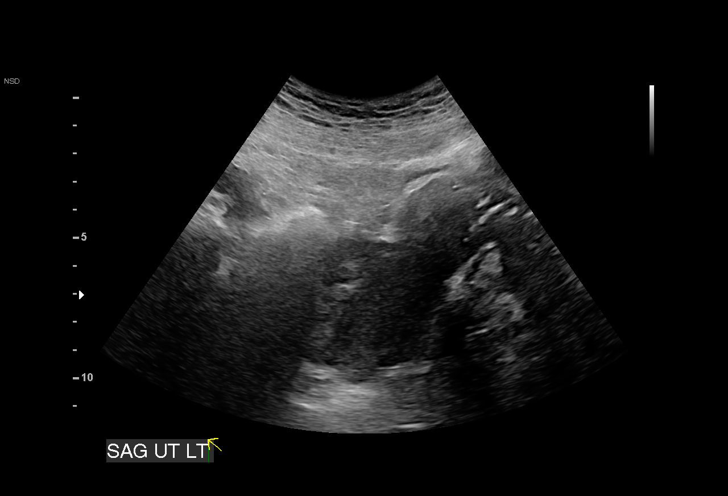
[im 5/28]
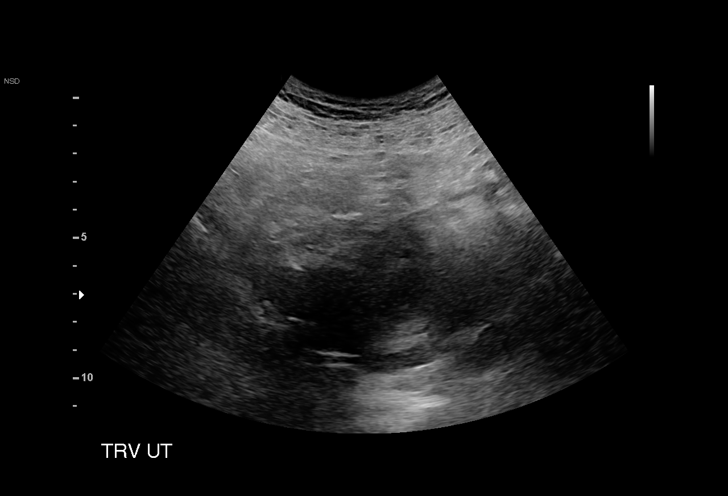
[im 7/28]
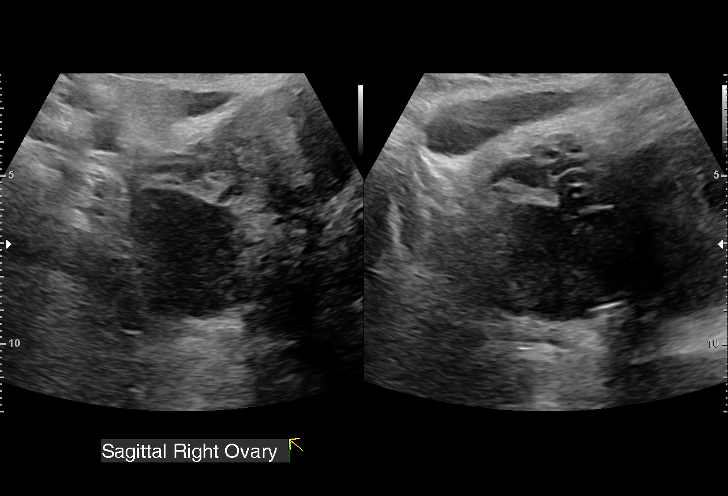
[im 9/28]
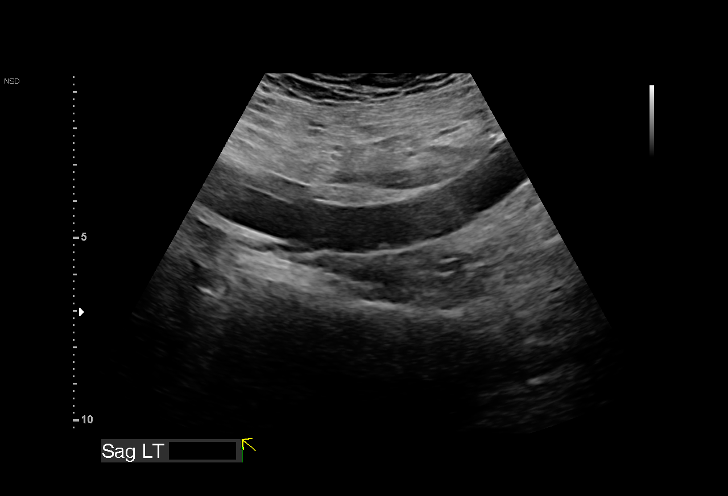
[im 11/28]
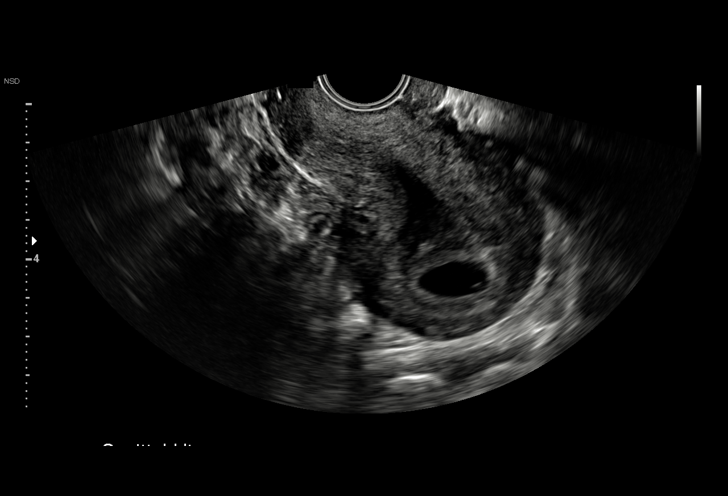
[im 13/28]
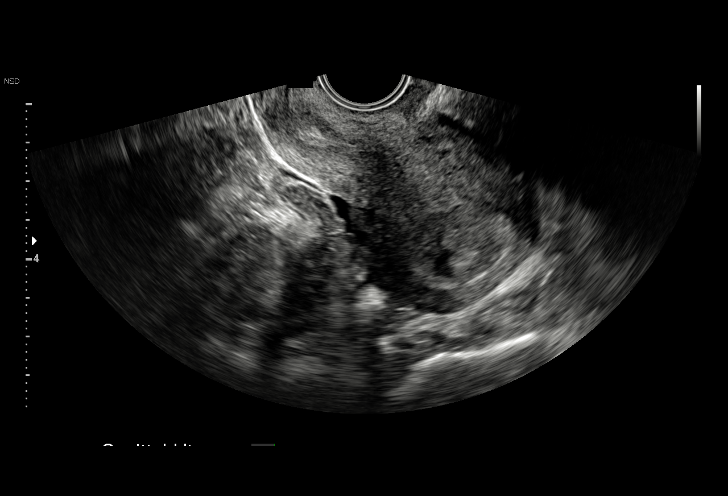
[im 15/28]
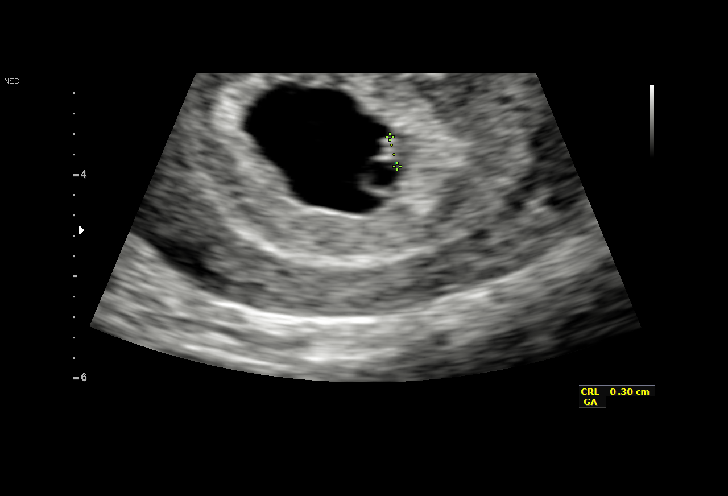
[im 16/28]
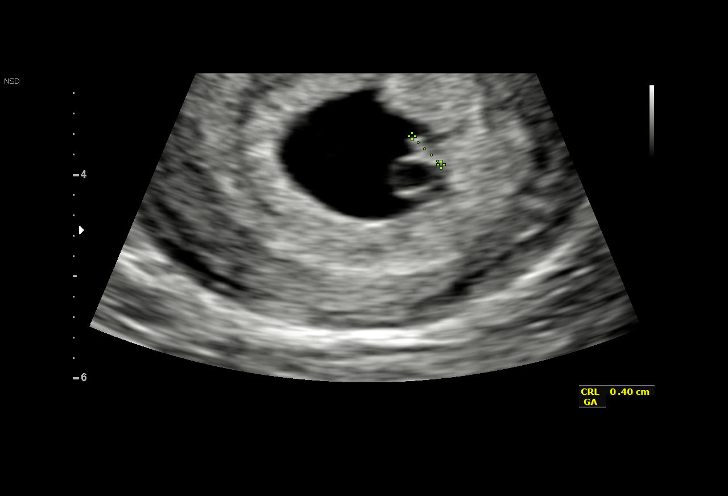
[im 18/28]
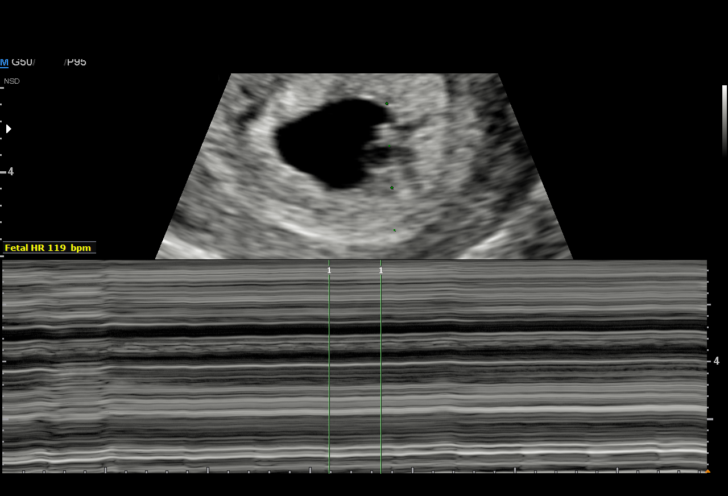
[im 20/28]
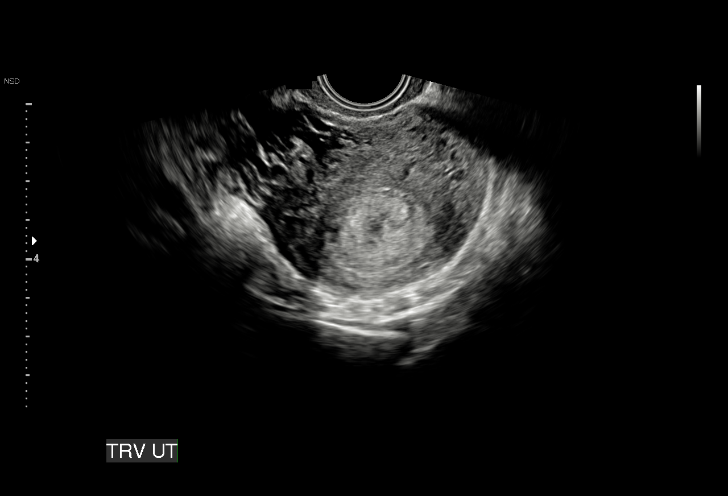
[im 22/28]
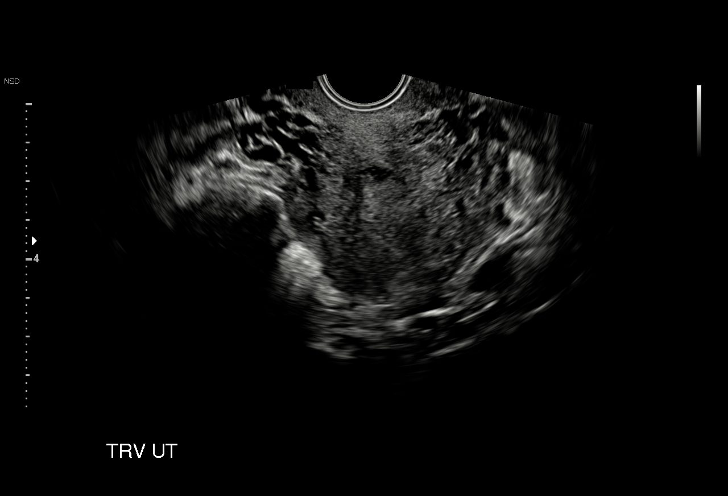
[im 24/28]
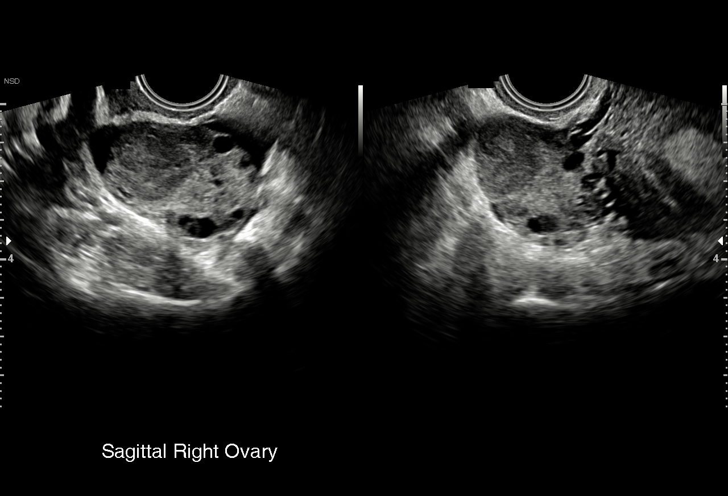
[im 26/28]
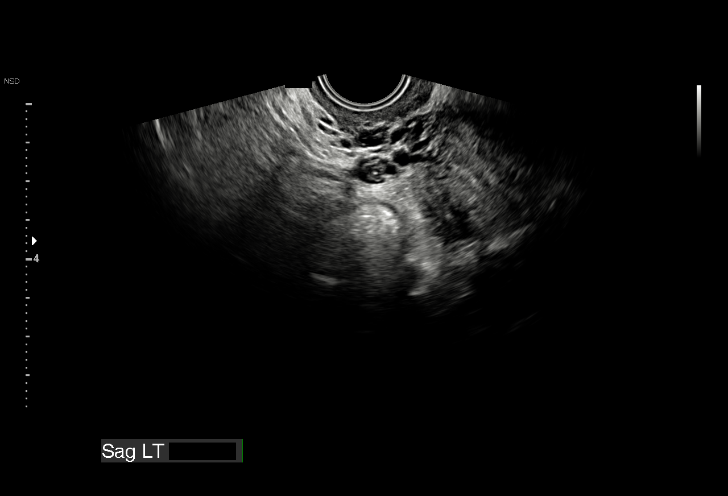
[im 28/28]
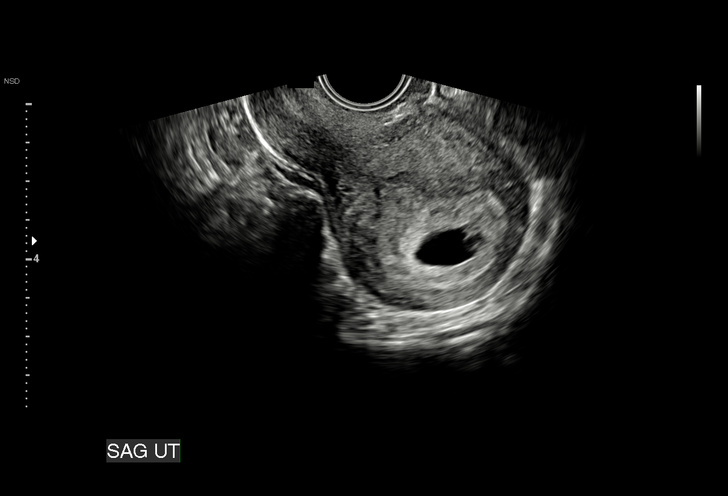

[15 of 28 positions shown; findings below may reference images not displayed]

FINDINGS: Intrauterine gestational sac: Single

Yolk sac:  Visualized.

Embryo:  Visualized.

Cardiac Activity: Visualized.

Heart Rate: 119  bpm

CRL:  3.7  mm   6 w   0 d                  US EDC: 09/08/2017

Subchorionic hemorrhage:  None visualized.

Maternal uterus/adnexae: Right ovary is notable for a corpus luteal
cyst.

Left ovary is not discretely visualized, reportedly surgically
absent.

No free fluid.
IMPRESSION: Single live intrauterine gestation, measuring 6 weeks 3 days by
crown-rump length, as above.

## 2019-12-25 ENCOUNTER — Emergency Department (HOSPITAL_COMMUNITY)
Admission: EM | Admit: 2019-12-25 | Discharge: 2019-12-26 | Disposition: A | Discharge status: Home / Self Care | Payer: Medicaid Other | Attending: Emergency Medicine | Admitting: Emergency Medicine

## 2019-12-25 ENCOUNTER — Encounter (HOSPITAL_COMMUNITY): Payer: Self-pay | Admitting: Emergency Medicine

## 2019-12-25 ENCOUNTER — Other Ambulatory Visit: Payer: Self-pay

## 2019-12-25 DIAGNOSIS — Z5321 Procedure and treatment not carried out due to patient leaving prior to being seen by health care provider: Secondary | ICD-10-CM | POA: Insufficient documentation

## 2019-12-25 DIAGNOSIS — R569 Unspecified convulsions: Secondary | ICD-10-CM | POA: Diagnosis not present

## 2019-12-25 LAB — BASIC METABOLIC PANEL
Anion gap: 15 (ref 5–15)
BUN: 12 mg/dL (ref 6–20)
CO2: 22 mmol/L (ref 22–32)
Calcium: 9.5 mg/dL (ref 8.9–10.3)
Chloride: 100 mmol/L (ref 98–111)
Creatinine, Ser: 0.83 mg/dL (ref 0.44–1.00)
GFR calc Af Amer: >60 mL/min (ref >60)
GFR calc non Af Amer: >60 mL/min (ref >60)
Glucose, Bld: 94 mg/dL (ref 70–99)
Potassium: 3.6 mmol/L (ref 3.5–5.1)
Sodium: 137 mmol/L (ref 135–145)

## 2019-12-25 LAB — I-STAT BETA HCG BLOOD, ED (MC, WL, AP ONLY): I-stat hCG, quantitative: <5 m[IU]/mL (ref <5)

## 2019-12-25 NOTE — ED Notes (Signed)
Unable to get CBC tube at this time.

## 2019-12-25 NOTE — ED Triage Notes (Signed)
Pt reports taking a mucinex on Thursday and experiencing a "psuedo-seizure", pt reports she felt pins and needles off and on for 6 hours, and states she has been feeling "deja-vu" since. Answering questions appropriately at this time.

## 2019-12-26 NOTE — ED Notes (Signed)
Pt called x3 with no response  

## 2020-09-18 NOTE — Progress Notes (Shared)
[facility] - Clinic Note  09/19/2020     CHIEF COMPLAINT Patient presents for No chief complaint on file.   HISTORY OF PRESENT ILLNESS: Amber Hancock is a 30 y.o. female who presents to the clinic today for:     Referring physician: Teena Irani, PA-C [address] [place],  [place] [address]  HISTORICAL INFORMATION:   Selected notes from the MEDICAL RECORD NUMBER Referred by Dr. Nedra Hai:  Ocular Hx- PMH-    CURRENT MEDICATIONS: No current outpatient medications on file. (Ophthalmic Drugs)   No current facility-administered medications for this visit. (Ophthalmic Drugs)   Current Outpatient Medications (Other)  Medication Sig  . cyclobenzaprine (FLEXERIL) 10 MG tablet Take 1 tablet (10 mg total) by mouth 3 (three) times daily as needed for muscle spasms.  Marland Kitchen doxylamine, Sleep, (UNISOM) 25 MG tablet Take 25 mg by mouth at bedtime.  Marland Kitchen FLUoxetine (PROZAC) 20 MG capsule Take 1 capsule (20 mg total) by mouth daily.  Marland Kitchen ibuprofen (ADVIL,MOTRIN) 800 MG tablet Take 1 tablet (800 mg total) by mouth every 6 (six) hours as needed for mild pain or moderate pain.  Marland Kitchen ibuprofen (ADVIL,MOTRIN) 800 MG tablet Take 1 tablet (800 mg total) by mouth every 8 (eight) hours as needed.  . norethindrone (ORTHO MICRONOR) 0.35 MG tablet Take 1 tablet (0.35 mg total) by mouth daily.  . ondansetron (ZOFRAN) 8 MG tablet Take 1 tablet (8 mg total) by mouth every 8 (eight) hours as needed for nausea or vomiting.  Marland Kitchen oxyCODONE (OXY IR/ROXICODONE) 5 MG immediate release tablet Take 1 tablet (5 mg total) by mouth every 6 (six) hours as needed for severe pain.  Marland Kitchen oxyCODONE-acetaminophen (PERCOCET/ROXICET) 5-325 MG tablet Take 1 tablet by mouth every 6 (six) hours as needed for moderate pain.  . pantoprazole (PROTONIX) 40 MG tablet Take 40 mg by mouth at bedtime.  . Prenatal Vit-Fe Fumarate-FA (PRENATAL COMPLETE) 14-0.4 MG TABS Take 1 tablet by mouth daily.   No current  facility-administered medications for this visit. (Other)      REVIEW OF SYSTEMS:    ALLERGIES Allergies  Allergen Reactions  . Adhesive [Tape] Rash and Hives    Reaction to adhesive bandage after surgery Reaction to adhesive bandage after surgery  . Gabapentin Other (See Comments)    Increased anxiety.  Did things she doesn't remember doing. Other reaction(s): Other Increased anxiety.Did things she doesn't remember doing.  . Onion Hives  . Oxycodone-Acetaminophen Nausea And Vomiting  . Tylox [Oxycodone-Acetaminophen] Nausea And Vomiting  . Vicodin [Hydrocodone-Acetaminophen] Nausea And Vomiting  . Isopropyl Alcohol     Makes skin burn  . Other Hives    Reaction to adhesive bandage after surgery  . Fluoxetine Anxiety    PAST MEDICAL HISTORY Past Medical History:  Diagnosis Date  . ADHD   . Anxiety   . Autism   . Bilateral ovarian cysts   . Chronic pelvic pain in female   . Depression   . Fibromyalgia    chronic myofacial pain  . Headache   . Malignant hyperthermia    MGM  . Streak gonad    Left  . Vaginal Pap smear, abnormal    Past Surgical History:  Procedure Laterality Date  . CESAREAN SECTION N/A 09/01/2017   Procedure: PRIMARY CESAREAN SECTION;  Surgeon: Osborn Coho, MD;  Location: [facility] BIRTHING SUITES;  Service: Obstetrics;  Laterality: N/A;  RNFA requested  . LAPAROTOMY    . SALPINGOOPHORECTOMY  FAMILY HISTORY Family History  Problem Relation Age of Onset  . Diverticulitis Mother   . Hypertension Mother   . Cancer Paternal Grandmother   . Anesthesia problems Paternal Grandmother   . Post-traumatic stress disorder Father   . Eczema Father   . Mental illness Brother   . Depression Sister   . Heart failure Maternal Grandfather   . Allergic rhinitis Neg Hx   . Asthma Neg Hx   . Angioedema Neg Hx   . Urticaria Neg Hx     SOCIAL HISTORY Social History   Tobacco Use  . Smoking status: Never Smoker  . Smokeless tobacco: Never Used   Vaping Use  . Vaping Use: Never used  Substance Use Topics  . Alcohol use: Not Currently    Alcohol/week: 0.0 - 2.0 standard drinks    Comment: occasional  . Drug use: No         OPHTHALMIC EXAM: Not recorded     IMAGING AND PROCEDURES  Imaging and Procedures for 09/19/2020           ASSESSMENT/PLAN:  No diagnosis found.  1.  2.  3.  Ophthalmic Meds Ordered this visit:  No orders of the defined types were placed in this encounter.      No follow-ups on file.  There are no Patient Instructions on file for this visit.   Explained the diagnoses, plan, and follow up with the patient and they expressed understanding.  Patient expressed understanding of the importance of proper follow up care.   This document serves as a record of services personally performed by Karie Chimera, MD, PhD. It was created on their behalf by Annalee Genta, COMT. The creation of this record is the provider's dictation and/or activities during the visit.  Electronically signed by: Annalee Genta, COMT 09/18/20 10:19 AM     Karie Chimera, M.D., Ph.D. Diseases & Surgery of the Retina and Vitreous [facility] @TODAY @     Abbreviations: M myopia (nearsighted); A astigmatism; H hyperopia (farsighted); P presbyopia; Mrx spectacle prescription;  CTL contact lenses; OD right eye; OS left eye; OU both eyes  XT exotropia; ET esotropia; PEK punctate epithelial keratitis; PEE punctate epithelial erosions; DES dry eye syndrome; MGD meibomian gland dysfunction; ATs artificial tears; PFAT's preservative free artificial tears; NSC nuclear sclerotic cataract; PSC posterior subcapsular cataract; ERM epi-retinal membrane; PVD posterior vitreous detachment; RD retinal detachment; DM diabetes mellitus; DR diabetic retinopathy; NPDR non-proliferative diabetic retinopathy; PDR proliferative diabetic retinopathy; CSME clinically significant macular edema; DME diabetic macular  edema; dbh dot blot hemorrhages; CWS cotton wool spot; POAG primary open angle glaucoma; C/D cup-to-disc ratio; HVF humphrey visual field; GVF goldmann visual field; OCT optical coherence tomography; IOP intraocular pressure; BRVO Branch retinal vein occlusion; CRVO central retinal vein occlusion; CRAO central retinal artery occlusion; BRAO branch retinal artery occlusion; RT retinal tear; SB scleral buckle; PPV pars plana vitrectomy; VH Vitreous hemorrhage; PRP panretinal laser photocoagulation; IVK intravitreal kenalog; VMT vitreomacular traction; MH Macular hole;  NVD neovascularization of the disc; NVE neovascularization elsewhere; AREDS age related eye disease study; ARMD age related macular degeneration; POAG primary open angle glaucoma; EBMD epithelial/anterior basement membrane dystrophy; ACIOL anterior chamber intraocular lens; IOL intraocular lens; PCIOL posterior chamber intraocular lens; Phaco/IOL phacoemulsification with intraocular lens placement; PRK photorefractive keratectomy; LASIK laser assisted in situ keratomileusis; HTN hypertension; DM diabetes mellitus; COPD chronic obstructive pulmonary disease

## 2020-09-19 ENCOUNTER — Other Ambulatory Visit: Payer: Self-pay

## 2020-09-19 ENCOUNTER — Encounter (INDEPENDENT_AMBULATORY_CARE_PROVIDER_SITE_OTHER): Payer: Medicaid Other | Admitting: Ophthalmology

## 2020-09-19 ENCOUNTER — Encounter (INDEPENDENT_AMBULATORY_CARE_PROVIDER_SITE_OTHER): Payer: Self-pay

## 2020-09-19 DIAGNOSIS — H3581 Retinal edema: Secondary | ICD-10-CM

## 2020-09-20 ENCOUNTER — Encounter (INDEPENDENT_AMBULATORY_CARE_PROVIDER_SITE_OTHER): Payer: BLUE CROSS/BLUE SHIELD | Admitting: Ophthalmology

## 2020-10-15 NOTE — Progress Notes (Signed)
[facility] - Clinic Note  10/17/2020     CHIEF COMPLAINT Patient presents for Retina Evaluation   HISTORY OF PRESENT ILLNESS: Amber Hancock is a 30 y.o. female who presents to the clinic today for:   HPI    Retina Evaluation    In both eyes.  This started 8 months ago.  Duration of 8 months.  Associated Symptoms Flashes and Floaters.  I, the attending physician,  performed the HPI with the patient and updated documentation appropriately.          Comments    Patient here for Retina Evaluation. Referred by Dr Conley Rolls. Patient states autistic. Vision doing ok. Glasses have helped. Having a lot of floaters bigger. Light sensitivity. Has health issues. Being tested.        Last edited by Rennis Chris, MD on 10/17/2020  4:31 PM. (History)    pt is here on the referral of Dr. Conley Rolls for concern of floaters, pt states she went to see her to get new glasses, she states she is also having a lot of light sensitivity and ocular migraines, she states she is having a lot of headaches as well, pt states she is being tested for some health problems, she was recently tested for diabetes, which was negative, pt has seen a neurologist, rheumatologist and cardiologists and has an appt with an endocrinologist, pt states she is autistic and has trouble communicating and understanding  Referring physician: Conley Rolls, My [facility] [address] [place], [place] [address]  HISTORICAL INFORMATION:   Selected notes from the MEDICAL RECORD NUMBER Referred by Dr. Aletta Edouard for increase in floaters, decrease in vision, and possible ocular migraine LEE:  Ocular Hx- PMH-    CURRENT MEDICATIONS: No current outpatient medications on file. (Ophthalmic Drugs)   No current facility-administered medications for this visit. (Ophthalmic Drugs)   Current Outpatient Medications (Other)  Medication Sig  . cyclobenzaprine (FLEXERIL) 10 MG tablet Take 1 tablet (10 mg total) by mouth 3 (three) times daily as needed for  muscle spasms.  Marland Kitchen doxylamine, Sleep, (UNISOM) 25 MG tablet Take 25 mg by mouth at bedtime.  Marland Kitchen FLUoxetine (PROZAC) 20 MG capsule Take 1 capsule (20 mg total) by mouth daily.  Marland Kitchen ibuprofen (ADVIL,MOTRIN) 800 MG tablet Take 1 tablet (800 mg total) by mouth every 6 (six) hours as needed for mild pain or moderate pain.  Marland Kitchen ibuprofen (ADVIL,MOTRIN) 800 MG tablet Take 1 tablet (800 mg total) by mouth every 8 (eight) hours as needed.  . norethindrone (ORTHO MICRONOR) 0.35 MG tablet Take 1 tablet (0.35 mg total) by mouth daily.  . ondansetron (ZOFRAN) 8 MG tablet Take 1 tablet (8 mg total) by mouth every 8 (eight) hours as needed for nausea or vomiting.  Marland Kitchen oxyCODONE (OXY IR/ROXICODONE) 5 MG immediate release tablet Take 1 tablet (5 mg total) by mouth every 6 (six) hours as needed for severe pain.  Marland Kitchen oxyCODONE-acetaminophen (PERCOCET/ROXICET) 5-325 MG tablet Take 1 tablet by mouth every 6 (six) hours as needed for moderate pain.  . pantoprazole (PROTONIX) 40 MG tablet Take 40 mg by mouth at bedtime.  . Prenatal Vit-Fe Fumarate-FA (PRENATAL COMPLETE) 14-0.4 MG TABS Take 1 tablet by mouth daily.   No current facility-administered medications for this visit. (Other)      REVIEW OF SYSTEMS: ROS    Positive for: Neurological, Eyes   Last edited by Laddie Aquas, COA on 10/17/2020  9:54 AM. (History)  ALLERGIES Allergies  Allergen Reactions  . Adhesive [Tape] Rash and Hives    Reaction to adhesive bandage after surgery Reaction to adhesive bandage after surgery  . Gabapentin Other (See Comments)    Increased anxiety.  Did things she doesn't remember doing. Other reaction(s): Other Increased anxiety.Did things she doesn't remember doing.  . Onion Hives  . Oxycodone-Acetaminophen Nausea And Vomiting  . Tylox [Oxycodone-Acetaminophen] Nausea And Vomiting  . Vicodin [Hydrocodone-Acetaminophen] Nausea And Vomiting  . Isopropyl Alcohol     Makes skin burn  . Other Hives    Reaction to  adhesive bandage after surgery  . Fluoxetine Anxiety    PAST MEDICAL HISTORY Past Medical History:  Diagnosis Date  . ADHD   . Anxiety   . Autism   . Bilateral ovarian cysts   . Chronic pelvic pain in female   . Depression   . Fibromyalgia    chronic myofacial pain  . Headache   . Malignant hyperthermia    MGM  . Streak gonad    Left  . Vaginal Pap smear, abnormal    Past Surgical History:  Procedure Laterality Date  . CESAREAN SECTION N/A 09/01/2017   Procedure: PRIMARY CESAREAN SECTION;  Surgeon: Osborn Coho, MD;  Location: [facility] BIRTHING SUITES;  Service: Obstetrics;  Laterality: N/A;  RNFA requested  . LAPAROTOMY    . SALPINGOOPHORECTOMY      FAMILY HISTORY Family History  Problem Relation Age of Onset  . Diverticulitis Mother   . Hypertension Mother   . Cancer Paternal Grandmother   . Anesthesia problems Paternal Grandmother   . Post-traumatic stress disorder Father   . Eczema Father   . Mental illness Brother   . Depression Sister   . Heart failure Maternal Grandfather   . Allergic rhinitis Neg Hx   . Asthma Neg Hx   . Angioedema Neg Hx   . Urticaria Neg Hx     SOCIAL HISTORY Social History   Tobacco Use  . Smoking status: Never Smoker  . Smokeless tobacco: Never Used  Vaping Use  . Vaping Use: Never used  Substance Use Topics  . Alcohol use: Not Currently    Alcohol/week: 0.0 - 2.0 standard drinks    Comment: occasional  . Drug use: No         OPHTHALMIC EXAM:  Base Eye Exam    Visual Acuity (Snellen - Linear)      Right Left   Dist [place] 20/20 -1 20/20 -1   Correction: Glasses       Tonometry (Tonopen, 9:50 AM)      Right Left   Pressure 19 19       Pupils      Dark Light Shape React APD   Right 3 2 Round Brisk None   Left 3 2 Round Brisk None       Visual Fields (Counting fingers)      Left Right    Full Full       Extraocular Movement      Right Left    Full Full       Neuro/Psych    Oriented x3: Yes    Mood/Affect: Normal       Dilation    Both eyes: 1.0% Mydriacyl, 2.5% Phenylephrine @ 9:49 AM        Slit Lamp and Fundus Exam    Slit Lamp Exam      Right Left   Lids/Lashes Normal Normal   Conjunctiva/Sclera White and quiet White  and quiet   Cornea trace PEE trace PEE   Anterior Chamber Deep and quiet Deep and quiet   Iris Round and dilated Round and dilated   Lens Clear Clear   Vitreous clear Mild Vitreous syneresis       Fundus Exam      Right Left   Disc Pink and Sharp Pink and Sharp, Compact   C/D Ratio 0.4 0.3   Macula Flat, Good foveal reflex, No heme or edema Flat, Good foveal reflex, No heme or edema   Vessels mild tortuousity mild tortuousity   Periphery Attached, No heme  Attached, No heme         Refraction    Wearing Rx      Sphere Cylinder Axis Add   Right +0.50 +0.25 069 none   Left +0.50 +0.25 107 none          IMAGING AND PROCEDURES  Imaging and Procedures for 10/17/2020  OCT, Retina - OU - Both Eyes       Right Eye Quality was good. Central Foveal Thickness: 281. Progression has no prior data. Findings include normal foveal contour, no IRF, no SRF.   Left Eye Quality was good. Central Foveal Thickness: 270. Progression has no prior data. Findings include normal foveal contour, no IRF, no SRF.   Notes *Images captured and stored on drive  Diagnosis / Impression:  NFP; no IRF/SRF OU  Clinical management:  See below  Abbreviations: NFP - Normal foveal profile. CME - cystoid macular edema. PED - pigment epithelial detachment. IRF - intraretinal fluid. SRF - subretinal fluid. EZ - ellipsoid zone. ERM - epiretinal membrane. ORA - outer retinal atrophy. ORT - outer retinal tubulation. SRHM - subretinal hyper-reflective material. IRHM - intraretinal hyper-reflective material                 ASSESSMENT/PLAN:    ICD-10-CM   1. Ocular migraine  G43.109   2. Retinal edema  H35.81 OCT, Retina - OU - Both Eyes    1. Ocular  migraine  - pt reports episodes of color objects in vision accompanied with headaches  - no ocular/ophthalmic exam findings to explain symptoms  - discussed diagnosis of ocular migraine, prognosis and treatment options  - pt established with Neurology -- recommend continued f/u there  - no ophthalmic intervention indicated or recommended  - BCVA 20/20 OU  - f/u here prn  2. No retinal edema on exam or OCT  Ophthalmic Meds Ordered this visit:  No orders of the defined types were placed in this encounter.      Return if symptoms worsen or fail to improve.  There are no Patient Instructions on file for this visit.   Explained the diagnoses, plan, and follow up with the patient and they expressed understanding.  Patient expressed understanding of the importance of proper follow up care.   This document serves as a record of services personally performed by Karie Chimera, MD, PhD. It was created on their behalf by Annalee Genta, COMT. The creation of this record is the provider's dictation and/or activities during the visit.  Electronically signed by: Annalee Genta, COMT 10/17/20 4:35 PM  This document serves as a record of services personally performed by Karie Chimera, MD, PhD. It was created on their behalf by Glee Arvin. Manson Passey, OA an ophthalmic technician. The creation of this record is the provider's dictation and/or activities during the visit.    Electronically signed by: Glee Arvin. Manson Passey, [place] 06.08.2022 4:35  PM  Karie ChimeraBrian G. Lataisha Colan, M.D., Ph.D. Diseases & Surgery of the Retina and Vitreous [facility]  I have reviewed the above documentation for accuracy and completeness, and I agree with the above. Karie ChimeraBrian G. Murrel Bertram, M.D., Ph.D. 10/17/20 4:35 PM  Abbreviations: M myopia (nearsighted); A astigmatism; H hyperopia (farsighted); P presbyopia; Mrx spectacle prescription;  CTL contact lenses; OD right eye; OS left eye; OU both eyes  XT exotropia; ET esotropia; PEK  punctate epithelial keratitis; PEE punctate epithelial erosions; DES dry eye syndrome; MGD meibomian gland dysfunction; ATs artificial tears; PFAT's preservative free artificial tears; NSC nuclear sclerotic cataract; PSC posterior subcapsular cataract; ERM epi-retinal membrane; PVD posterior vitreous detachment; RD retinal detachment; DM diabetes mellitus; DR diabetic retinopathy; NPDR non-proliferative diabetic retinopathy; PDR proliferative diabetic retinopathy; CSME clinically significant macular edema; DME diabetic macular edema; dbh dot blot hemorrhages; CWS cotton wool spot; POAG primary open angle glaucoma; C/D cup-to-disc ratio; HVF humphrey visual field; GVF goldmann visual field; OCT optical coherence tomography; IOP intraocular pressure; BRVO Branch retinal vein occlusion; CRVO central retinal vein occlusion; CRAO central retinal artery occlusion; BRAO branch retinal artery occlusion; RT retinal tear; SB scleral buckle; PPV pars plana vitrectomy; VH Vitreous hemorrhage; PRP panretinal laser photocoagulation; IVK intravitreal kenalog; VMT vitreomacular traction; MH Macular hole;  NVD neovascularization of the disc; NVE neovascularization elsewhere; AREDS age related eye disease study; ARMD age related macular degeneration; POAG primary open angle glaucoma; EBMD epithelial/anterior basement membrane dystrophy; ACIOL anterior chamber intraocular lens; IOL intraocular lens; PCIOL posterior chamber intraocular lens; Phaco/IOL phacoemulsification with intraocular lens placement; PRK photorefractive keratectomy; LASIK laser assisted in situ keratomileusis; HTN hypertension; DM diabetes mellitus; COPD chronic obstructive pulmonary disease

## 2020-10-17 ENCOUNTER — Encounter (INDEPENDENT_AMBULATORY_CARE_PROVIDER_SITE_OTHER): Payer: Self-pay | Admitting: Ophthalmology

## 2020-10-17 ENCOUNTER — Other Ambulatory Visit: Payer: Self-pay

## 2020-10-17 ENCOUNTER — Ambulatory Visit (INDEPENDENT_AMBULATORY_CARE_PROVIDER_SITE_OTHER): Payer: Medicaid Other | Admitting: Ophthalmology

## 2020-10-17 DIAGNOSIS — H3581 Retinal edema: Secondary | ICD-10-CM

## 2020-10-17 DIAGNOSIS — G43109 Migraine with aura, not intractable, without status migrainosus: Secondary | ICD-10-CM

## 2021-07-02 IMAGING — CT CT ABD-PELV W/ CM
2 of 4 series · 16 of 46 positions shown, 18 images · IV contrast (omnipaque)
Comparison: None.

CLINICAL DATA: Acute abdominal pain

EXAM:
CT ABDOMEN AND PELVIS WITH CONTRAST
TECHNIQUE: Multidetector CT imaging of the abdomen and pelvis was performed
using the standard protocol following bolus administration of
intravenous contrast.
CONTRAST:  100mL OMNIPAQUE IOHEXOL 300 MG/ML  SOLN

[Series 2: axial st · axial · 0.73mm/px · z∈[+436,+841]mm · 13 of 89 slices shown, 15 images]
[im 4/89  soft-tissue]
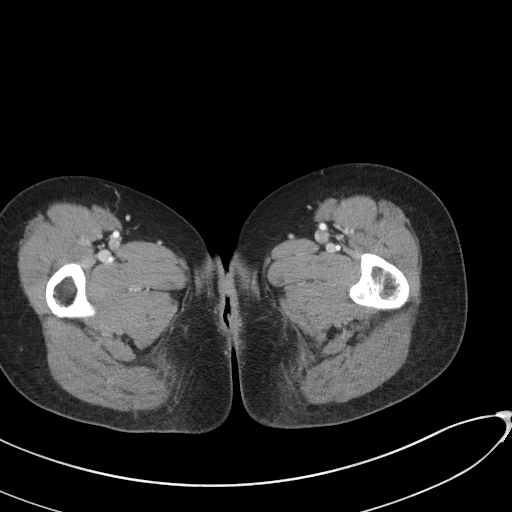
[im 4/89  bone]
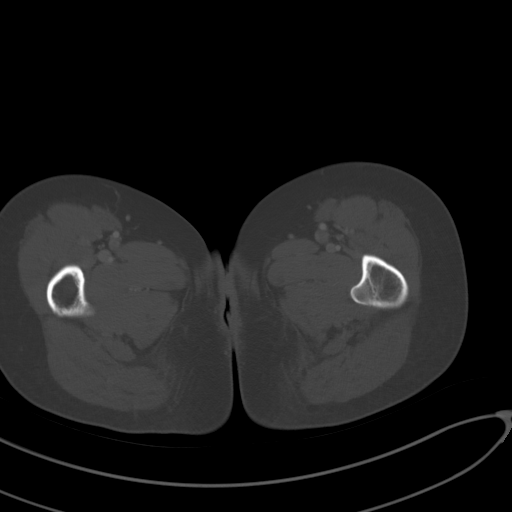
[im 12/89  soft-tissue]
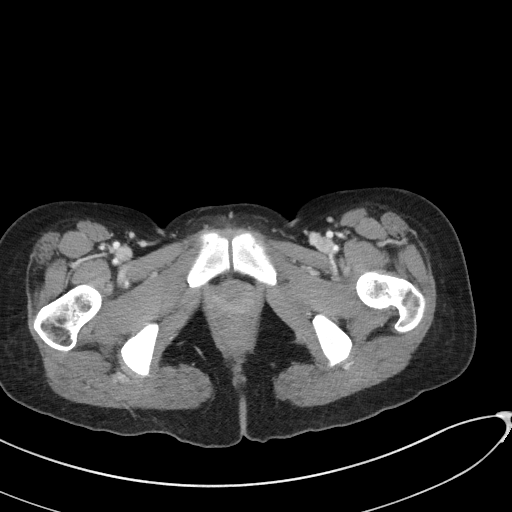
[im 19/89  soft-tissue]
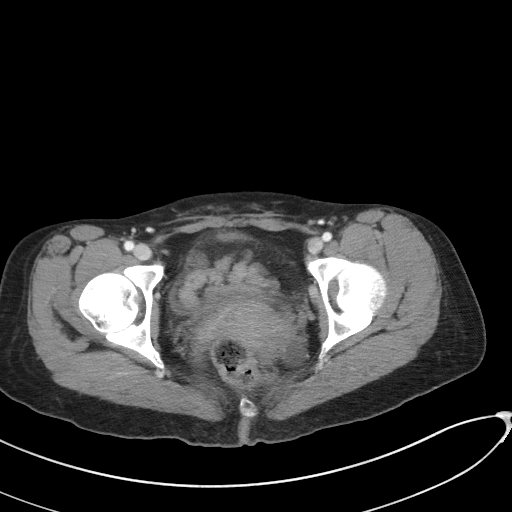
[im 26/89  soft-tissue]
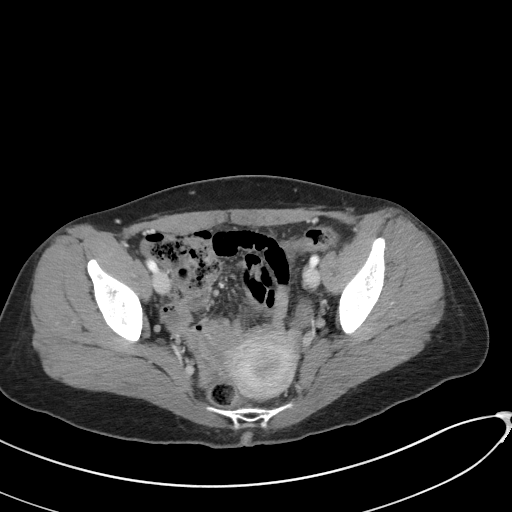
[im 30/89  soft-tissue]
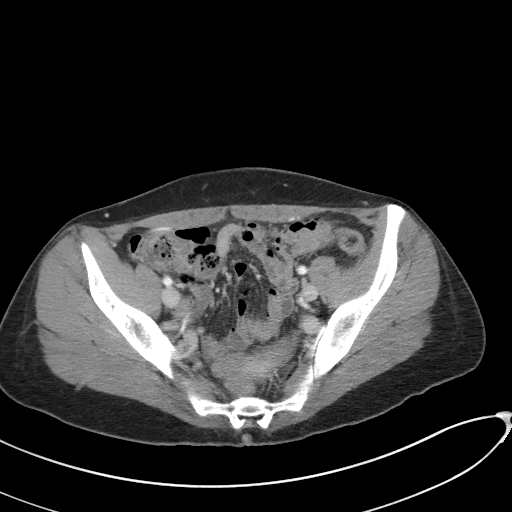
[im 37/89  soft-tissue]
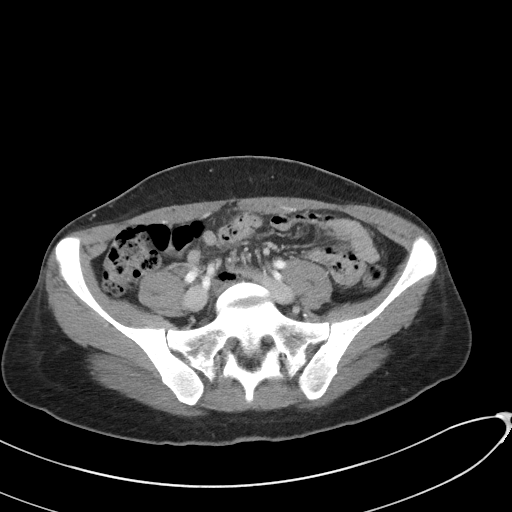
[im 45/89  soft-tissue]
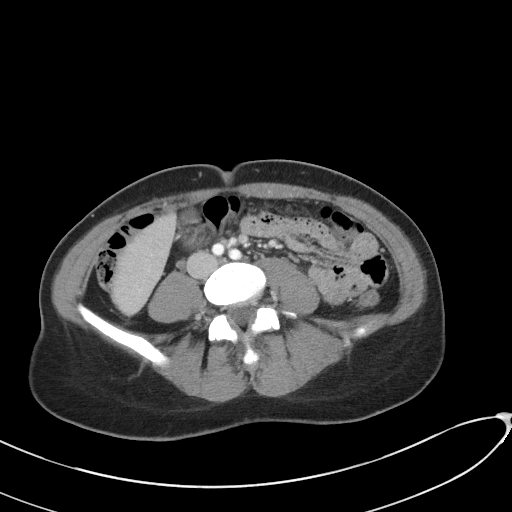
[im 52/89  soft-tissue]
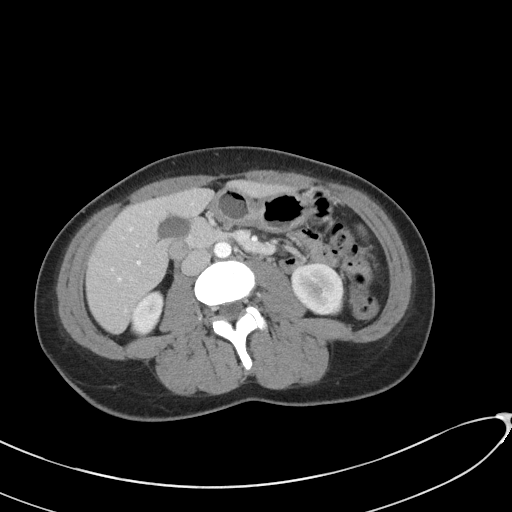
[im 59/89  soft-tissue]
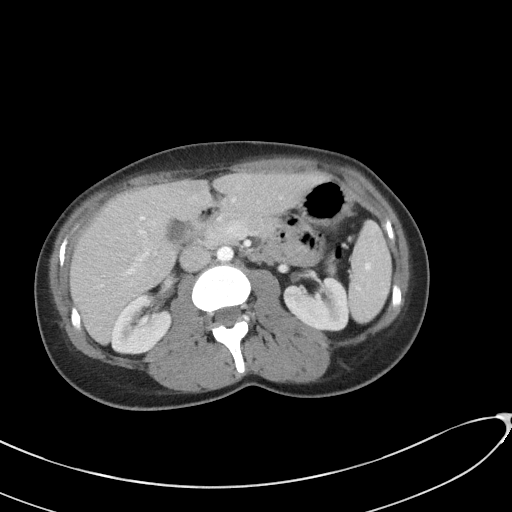
[im 59/89  bone]
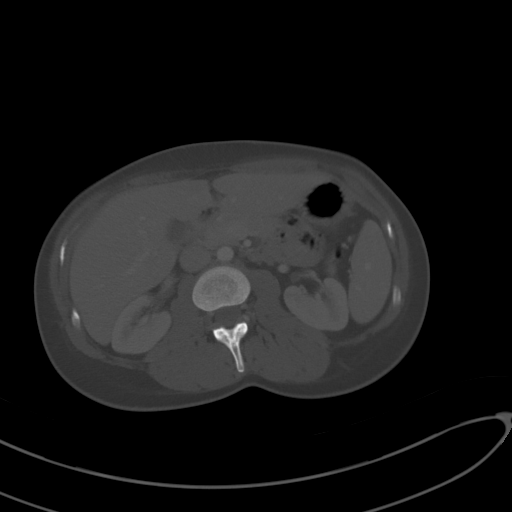
[im 63/89  soft-tissue]
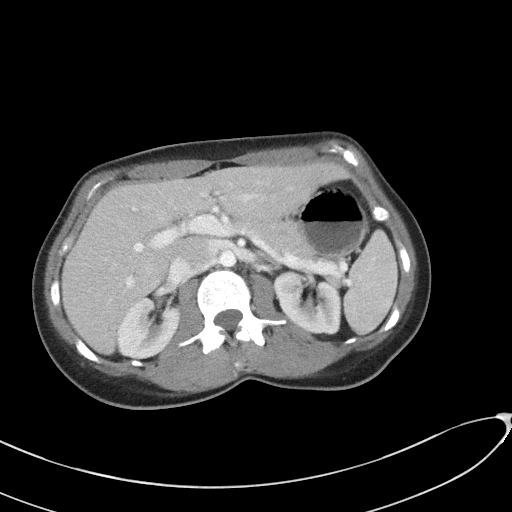
[im 70/89  soft-tissue]
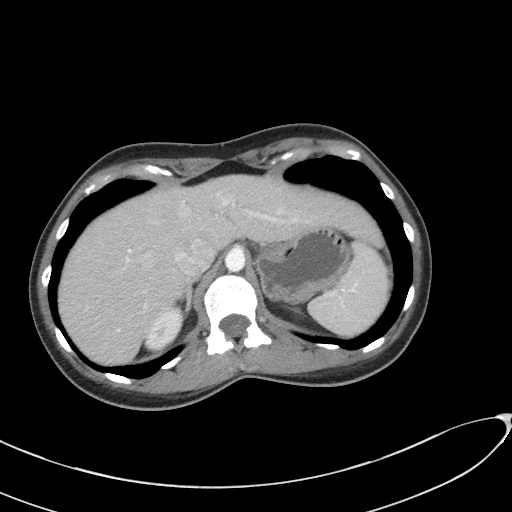
[im 78/89  soft-tissue]
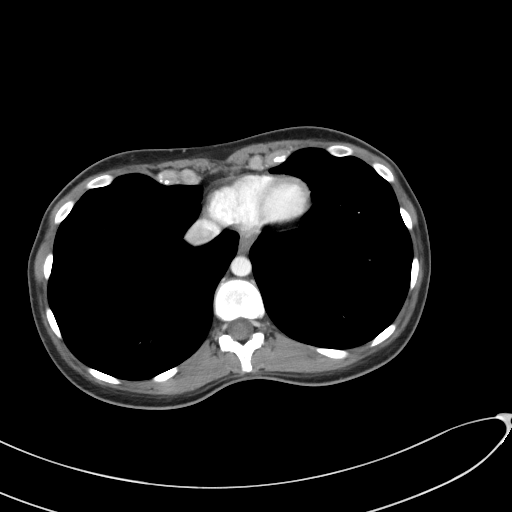
[im 85/89  soft-tissue]
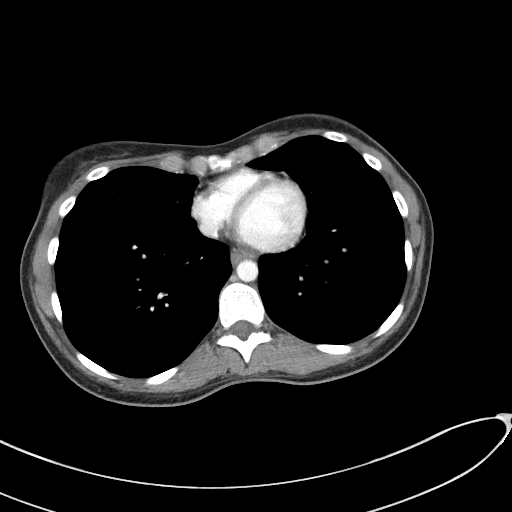

[Series 5: coronal st · coronal · 0.73mm/px · 3 of 70 slices shown]
[im 24/70  soft-tissue]
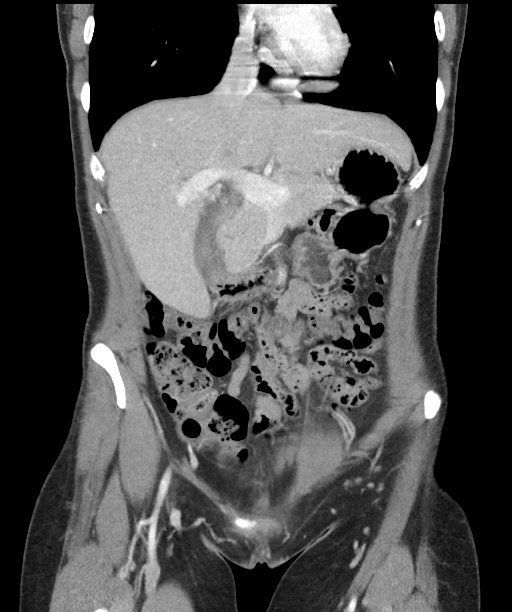
[im 31/70  soft-tissue]
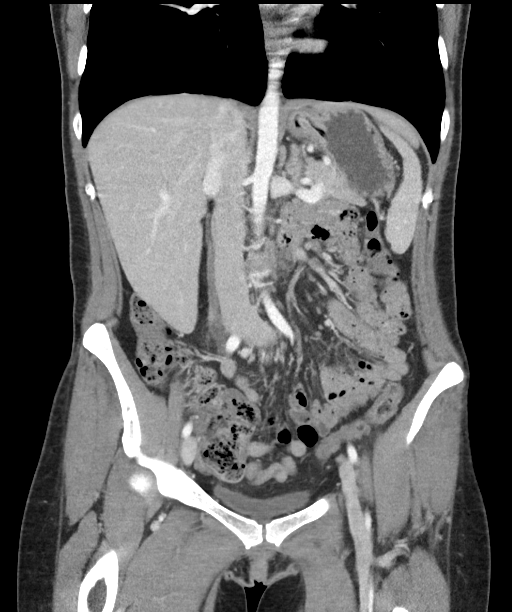
[im 39/70  soft-tissue]
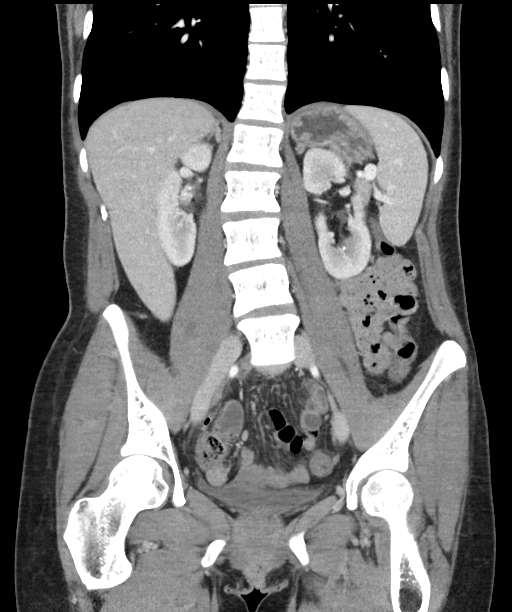

[16 of 46 positions shown; findings below may reference images not displayed]

FINDINGS: Lower chest: The visualized heart size within normal limits. No
pericardial fluid/thickening.

No hiatal hernia.

The visualized portions of the lungs are clear.

Hepatobiliary: The liver is normal in density without focal
abnormality.The main portal vein is patent. No evidence of calcified
gallstones, gallbladder wall thickening or biliary dilatation.

Pancreas: Unremarkable. No pancreatic ductal dilatation or
surrounding inflammatory changes.

Spleen: Normal in size without focal abnormality.

Adrenals/Urinary Tract: Both adrenal glands appear normal. The
kidneys and collecting system appear normal without evidence of
urinary tract calculus or hydronephrosis. Bladder is unremarkable.

Stomach/Bowel: The stomach, small bowel, and colon are normal in
appearance. No inflammatory changes, wall thickening, or obstructive
findings.There is a moderate amount of colonic stool. The appendix
is normal in appearance.

Vascular/Lymphatic: There are no enlarged mesenteric,
retroperitoneal, or pelvic lymph nodes. No significant vascular
findings are present.

Reproductive: There is a probable right corpus luteum cyst. A small
amount of free fluid seen within the cul-de-sac. Small amount of
fluid in the endometrial canal.

Other: No evidence of abdominal wall mass or hernia.

Musculoskeletal: No acute or significant osseous findings. Mild
dextroconvex scoliotic curvature of the lumbar spine.
IMPRESSION: Moderate amount of colonic stool without evidence of obstruction.

Probable right corpus luteum cyst.
